# Patient Record
Sex: Female | Born: 1970 | Race: Black or African American | Hispanic: No | Smoking: Never smoker
Health system: Southern US, Community
[De-identification: ages and names within clinical notes are randomized; demographics above are authoritative.]

## PROBLEM LIST (undated history)

## (undated) DIAGNOSIS — E039 Hypothyroidism, unspecified: Secondary | ICD-10-CM

## (undated) DIAGNOSIS — E041 Nontoxic single thyroid nodule: Secondary | ICD-10-CM

## (undated) HISTORY — PX: ABDOMINAL HYSTERECTOMY: SHX81

## (undated) HISTORY — DX: Nontoxic single thyroid nodule: E04.1

## (undated) HISTORY — DX: Hypothyroidism, unspecified: E03.9

---

## 1998-11-20 DIAGNOSIS — E041 Nontoxic single thyroid nodule: Secondary | ICD-10-CM

## 1998-11-20 DIAGNOSIS — E039 Hypothyroidism, unspecified: Secondary | ICD-10-CM

## 1998-11-20 HISTORY — DX: Hypothyroidism, unspecified: E03.9

## 1998-11-20 HISTORY — DX: Nontoxic single thyroid nodule: E04.1

## 1998-12-21 ENCOUNTER — Ambulatory Visit (HOSPITAL_COMMUNITY): Admission: RE | Admit: 1998-12-21 | Discharge: 1998-12-21 | Payer: Self-pay | Admitting: Unknown Physician Specialty

## 1999-03-02 ENCOUNTER — Other Ambulatory Visit: Admission: RE | Admit: 1999-03-02 | Discharge: 1999-03-02 | Payer: Self-pay | Admitting: Family Medicine

## 1999-03-02 ENCOUNTER — Encounter: Payer: Self-pay | Admitting: Surgery

## 1999-03-04 ENCOUNTER — Ambulatory Visit (HOSPITAL_COMMUNITY): Admission: RE | Admit: 1999-03-04 | Discharge: 1999-03-05 | Payer: Self-pay | Admitting: Surgery

## 1999-03-21 HISTORY — PX: THYROIDECTOMY, PARTIAL: SHX18

## 2001-06-20 ENCOUNTER — Ambulatory Visit (HOSPITAL_COMMUNITY): Admission: RE | Admit: 2001-06-20 | Discharge: 2001-06-20 | Payer: Self-pay | Admitting: Obstetrics and Gynecology

## 2004-01-12 ENCOUNTER — Other Ambulatory Visit: Admission: RE | Admit: 2004-01-12 | Discharge: 2004-01-12 | Payer: Self-pay | Admitting: Family Medicine

## 2005-10-17 ENCOUNTER — Encounter: Admission: RE | Admit: 2005-10-17 | Discharge: 2005-10-17 | Payer: Self-pay | Admitting: Internal Medicine

## 2006-04-30 ENCOUNTER — Other Ambulatory Visit: Admission: RE | Admit: 2006-04-30 | Discharge: 2006-04-30 | Payer: Self-pay | Admitting: Internal Medicine

## 2006-05-04 ENCOUNTER — Encounter: Admission: RE | Admit: 2006-05-04 | Discharge: 2006-05-04 | Payer: Self-pay | Admitting: Internal Medicine

## 2007-05-14 ENCOUNTER — Inpatient Hospital Stay (HOSPITAL_COMMUNITY): Admission: RE | Admit: 2007-05-14 | Discharge: 2007-05-16 | Payer: Self-pay | Admitting: Obstetrics and Gynecology

## 2007-05-14 ENCOUNTER — Encounter (INDEPENDENT_AMBULATORY_CARE_PROVIDER_SITE_OTHER): Payer: Self-pay | Admitting: Obstetrics and Gynecology

## 2009-02-16 ENCOUNTER — Other Ambulatory Visit: Admission: RE | Admit: 2009-02-16 | Discharge: 2009-02-16 | Payer: Self-pay | Admitting: Internal Medicine

## 2010-03-10 ENCOUNTER — Encounter: Admission: RE | Admit: 2010-03-10 | Discharge: 2010-03-10 | Payer: Self-pay | Admitting: Internal Medicine

## 2011-04-04 NOTE — Op Note (Signed)
NAME:  Kiara Peters, Kiara Peters NO.:  [id number]   MEDICAL RECORD NO.:  [id number]          PATIENT TYPE:  AMB   LOCATION:  SDC                           FACILITY:  [facility]   PHYSICIAN:  Lenoard Aden, M.D.DATE OF BIRTH:  22-Jan-1971   DATE OF PROCEDURE:  05/14/2007  DATE OF DISCHARGE:                               OPERATIVE REPORT   PREOPERATIVE DIAGNOSIS:  Symptomatic fibroids, dysmenorrhea,  menorrhagia.   POSTOPERATIVE DIAGNOSIS:  Symptomatic fibroids, dysmenorrhea,  menorrhagia, plus enterocele.   PROCEDURE:  Diagnostic laparoscopy, total abdominal hysterectomy,  enterocele repair with Rogelio Seen culdoplasty.   SURGEON:  Lenoard Aden, M.D.   ASSISTANT:  Dr. Cherly Hensen   ANESTHESIA:  General.   ESTIMATED BLOOD LOSS:  400 mL   COMPLICATIONS:  None.   DRAINS:  Foley.   COUNTS:  Correct.  The patient went to recovery in good condition.   DESCRIPTION OF PROCEDURE:  After being apprised of risks of anesthesia,  infection, bleeding, injury to abdominal organs with need for repair,  delayed versus immediate complications to include bowel and bladder  injury, the patient was brought to the operating room where she was  administered a general anesthetic without complications, prepped, draped  in sterile fashion.  SED's are placed in normal thigh high fashion.  Feet placed in the yellow fin stirrups.  Foley catheter placed after the  patient's prepped and draped in sterile fashion.  Rumi retractor placed  per vagina.  Infraumbilical incision made with a scalpel.  Veress needle  placed opening pressure -5 noted, 4 liters CO2 insufflated without  difficulty.  Trocar visualization, trocar placed atraumatically.  Visualization noted that the uterus is about 16 weeks size with  bilateral lateral fibroid and inability to visualize the anterior  posterior cul-de-sac or the uterine vasculature. At this time decision  made to proceed with abdominal hysterectomy thereby the  instruments  removed from the umbilical port. Incision was closed using 0 Vicryl and  Dermabond.  Pfannenstiel's skin incision is made, carried down to fascia  which was nicked in midline and opened transversely using Mayo scissors.  Rectus muscles dissected sharply in midline, peritoneum entered sharply  and peritoneal entry made.  Uterus was elevated through the operative  field, round ligaments were bilaterally grasped and suture ligated and  retroperitoneal space is entered. Ureters identified bilaterally. Tubo-  ovarian round ligament complexes were bilaterally clamped and suture  ligated. Uterine vessels skeletonized bilaterally and clamped using  LigaSure on the left and doubly with Heaney clamp on the right. At this  time good hemostasis noted.  Progressive bites were taken down the broad  and cardinal ligament complex.  The bladder flap was developed sharply,  ureter's reidentified during the process. Uterine vessels having been  taken bilaterally.  The specimen is then truncated and the cervix is  visualized partial specimen is removed.  Cervix is then removed taking  progressive bites down the broad cardinal ligament complex, suture  ligating all pedicles. The uterosacral ligaments are ligated bilaterally  and held, vaginal entry made.  Specimen removed. Vagina closed side-to-  side using 0 Vicryl  interrupted suture. Enterocele was identified and  closed using a 0 Vicryl McCall culdoplasty suture.  Uterosacral  ligaments are transfixed to the vaginal cuff.  Irrigation was  accomplished.  Good hemostasis noted.  Ureters were bilaterally noted to  be peristalsing normally. At this time the irrigation is accomplished  and good hemostasis noted.  Fascia is then closed using 0 Monocryl in  continuous running fashion.  Subcutaneous tissue reapproximated using 0  plain suture.  Skin closed using staples.  The patient tolerates  procedure well and was transferred to recovery in good  condition.      Lenoard Aden, M.D.  Electronically Signed     RJT/MEDQ  D:  05/14/2007  T:  05/14/2007  Job:  [id number]

## 2011-04-04 NOTE — H&P (Signed)
NAME:  Kiara Peters, Kiara Peters NO.:  [id number]   MEDICAL RECORD NO.:  [id number]          PATIENT TYPE:  AMB   LOCATION:  SDC                           FACILITY:  [facility]   PHYSICIAN:  Lenoard Aden, M.D.DATE OF BIRTH:  1971-07-14   DATE OF ADMISSION:  DATE OF DISCHARGE:                              HISTORY & PHYSICAL   CHIEF COMPLAINT:  Dysmenorrhea, menorrhagia and symptomatic fibroids.  She is a 40 year old G2 status post tubal ligation and symptomatic  fibroids for definitive therapy.   She has no known drug allergies.   MEDICATIONS:  Are vitamins and Synthroid.   She is a nonsmoker and nondrinker. She denies domestic or physical  violence.   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 She has a family history of lung cancer,  diabetes and hypertension. She has had 2 uncomplicated pregnancies to  date.   On physical exam, blood pressure 120/78 with a weight of 217 pounds.  HEENT:  Is normal.  LUNGS:  Are clear.  HEART:  Regular rhythm.  ABDOMEN:  Soft, nontender. Uterus was mobile at 12 to 14 weeks' size. No  adnexal masses. Confirmed by ultrasound. Multiple 4 to 6 cm uterine  fibroids with bilateral normal ovaries.  EXTREMITIES:  Revealed no cords.  NEUROLOGIC EXAM:  Nonfocal.   IMPRESSION:  Symptomatic uterine fibroids with refractory dysmenorrhea,  menorrhagia and history of secondary anemia.   PLAN:  Is to proceed with LAVH versus TLH versus TAH with ovarian  conservation. Risks of anesthesia, infection, bleeding, injury to  abdominal organs and need for repair was discussed.  Delayed versus  immediate complications to include bowel and bladder injury noted.  Inability to perform this procedure laparoscopically with possible need  for abdominal approach was discussed with the patient. She acknowledges  and wishes to  proceed.      Lenoard Aden, M.D.  Electronically Signed     RJT/MEDQ  D:  05/14/2007  T:  05/14/2007  Job:  [id number]

## 2011-04-07 NOTE — Op Note (Signed)
[facility]  Patient:    Kiara Peters, Kiara Peters                        MRN: [id number] Proc. Date: 06/20/01 Attending:  Duke Salvia. Marcelle Overlie, M.D.                           Operative Report  PREOPERATIVE DIAGNOSIS:       Requests permanent sterilization.  POSTOPERATIVE DIAGNOSES:      1. Requests permanent sterilization.                               2. Post uterine leiomyoma.  OPERATION:                    Diagnostic laparoscopy with Filshie clip tubal ligation.  SURGEON:                      Duke Salvia. Marcelle Overlie, M.D.  ANESTHESIA:                   General endotracheal anesthesia.  COMPLICATIONS:                None.  DRAINS:                       In and out catheter.  ESTIMATED BLOOD LOSS:         Less than 5 cc.  DESCRIPTION OF PROCEDURE AND FINDINGS:  The patient was taken to the operating room.  After an adequate level of general endotracheal anesthesia was obtained and with legs in stirrups, the abdomen, perineum, and vagina were prepped and draped in the usual manner for laparoscopy.  The bladder was drained. Examination under anesthesia was carried out, and uterus was felt to be 6 to 7 week size, slightly irregular, adnexa unremarkable.  A Hulka tenaculum was positioned.  Attention was directed to the abdomen where a 2 cm subumbilical incision was made.  The Veress needle was introduced without difficulty.  A central abdominal position was verified by pressure and water testing.  After a 2 liter pneumoperitoneum was then created, laparoscopic trocar and sleeve were inserted without difficulty.  There was no evidence of any bleeding or trauma.  With the patient in Trendelenburg and uterus anteflexed, the pelvic findings were as follows.  The uterus itself was enlarge to 6 to 8-week size with 3 to 4 irregular leiomyoma.  The cul-de-sac and adnexal areas were unremarkable.  Marcaine 0.5% plain, 3 to 5 cc, were dripped across the tube from the  cornu to the fimbriated end on either side.  Filshie clip applicator was back loaded.  The right tube was positively identified and traced to its fimbriated end, regrasped 2 cm from the cornu, applied at a right angle and completely engulfing the tube with excellent application.  The exact same was repeated on the left side after carefully identifying the tube.  The applicator was removed.  Careful inspection magnified revealed that the Filshie clip application was excellent on each side. Instruments were removed, gas allowed to escape.  Defects closed with 4-0 Dexon subcuticular sutures.  She tolerated this well and went to the recovery room in good condition. DD:  06/20/01 TD:  06/20/01 Job: [id number]

## 2011-04-07 NOTE — Discharge Summary (Signed)
NAME:  VALMAI, VANDENBERGHE NO.:  [id number]   MEDICAL RECORD NO.:  [id number]          PATIENT TYPE:  INP   LOCATION:  9304                          FACILITY:  [facility]   PHYSICIAN:  Lenoard Aden, M.D.DATE OF BIRTH:  [birth year]   DATE OF ADMISSION:  05/14/2007  DATE OF DISCHARGE:  05/16/2007                               DISCHARGE SUMMARY   HOSPITAL COURSE:  The patient underwent an uncomplicated diagnostic  laparoscopic TAH with enterocele repair on May 14, 2007.  Postoperative  course uncomplicated.  Discharged to home on day #2.  Discharge teaching  done.   DISCHARGE MEDICATIONS:  1. Tylox.  2. Prenatal vitamins.  3. Iron.   FOLLOW UP:  Follow up in the office in 4-6 weeks.      Lenoard Aden, M.D.  Electronically Signed     RJT/MEDQ  D:  07/24/2007  T:  07/25/2007  Job:  [id number]

## 2011-04-07 NOTE — H&P (Signed)
[facility]  Patient:    Kiara Peters, Kiara Peters                        MRN: [id number] Attending:  Duke Salvia. Marcelle Overlie, M.D.                         History and Physical  SCHEDULED SURGERY DATE:       06/20/2001.  CHIEF COMPLAINT:              Requests permanent sterilization.  HISTORY OF PRESENT ILLNESS:   A 40 year old, G2, P2, LC2, using condoms for contraception, presents for tubal ligation. She was sure she would not want to be pregnant again under any circumstances. The tubal procedure, including risk relative to bleeding, infection, adjacent organ injury with the possible need for open or additional surgery, permanence of the procedure, and failure rate of 2 to 3 per 1000 all reviewed with her. She remains firm in her decision to proceed.  PAST SURGICAL HISTORY:        One cesarean section, one vaginal delivery. Other surgery includes history of a prior thyroidectomy.  PHYSICAL EXAMINATION:  VITAL SIGNS:                  Temperature 98.2, blood pressure 120/72.  HEENT:                        Unremarkable.  NECK:                         Supple without masses.  LUNGS:                        Clear.  CARDIOVASCULAR:               Regular rate and rhythm without murmurs, rubs, or gallops.  BREASTS:                      Without masses.  ABDOMEN:                      Soft, flat, nontender.  PELVIC:                       Normal external genitalia. Vagina and cervix clear. Uterus anterior, normal size, adnexa negative.  IMPRESSION:                   Requests permanent sterilization.  PLAN:                         Tubal ligation by Filshie clip application. Procedure and risks discussed as above. DD:  06/18/01 TD:  06/18/01 Job: [id number]

## 2011-05-22 ENCOUNTER — Other Ambulatory Visit: Payer: Self-pay | Admitting: Internal Medicine

## 2011-05-22 DIAGNOSIS — Z1231 Encounter for screening mammogram for malignant neoplasm of breast: Secondary | ICD-10-CM

## 2011-05-26 ENCOUNTER — Ambulatory Visit
Admission: RE | Admit: 2011-05-26 | Discharge: 2011-05-26 | Disposition: A | Discharge status: Home / Self Care | Payer: BC Managed Care – PPO | Source: Ambulatory Visit | Attending: Internal Medicine | Admitting: Internal Medicine

## 2011-05-26 DIAGNOSIS — Z1231 Encounter for screening mammogram for malignant neoplasm of breast: Secondary | ICD-10-CM

## 2011-09-06 LAB — CBC
HCT: 16 — ABNORMAL LOW
HCT: 19.3 — ABNORMAL LOW
HCT: 32.1 — ABNORMAL LOW
Hemoglobin: 10.4 — ABNORMAL LOW
Hemoglobin: 5.2 — CL
Hemoglobin: 6.3 — CL
MCHC: 32.2
MCHC: 32.5
MCV: 83
RBC: 1.92 — ABNORMAL LOW
RBC: 2.38 — ABNORMAL LOW
RBC: 3.96
RDW: 16 — ABNORMAL HIGH
RDW: 16.1 — ABNORMAL HIGH
WBC: 15.1 — ABNORMAL HIGH

## 2011-09-06 LAB — SAMPLE TO BLOOD BANK

## 2012-05-16 ENCOUNTER — Other Ambulatory Visit: Payer: Self-pay | Admitting: Internal Medicine

## 2012-05-16 DIAGNOSIS — Z1231 Encounter for screening mammogram for malignant neoplasm of breast: Secondary | ICD-10-CM

## 2012-06-14 ENCOUNTER — Ambulatory Visit
Admission: RE | Admit: 2012-06-14 | Discharge: 2012-06-14 | Disposition: A | Discharge status: Home / Self Care | Payer: BC Managed Care – PPO | Source: Ambulatory Visit | Attending: Internal Medicine | Admitting: Internal Medicine

## 2012-06-14 DIAGNOSIS — Z1231 Encounter for screening mammogram for malignant neoplasm of breast: Secondary | ICD-10-CM

## 2013-09-18 ENCOUNTER — Other Ambulatory Visit: Payer: Self-pay

## 2013-09-18 DIAGNOSIS — Z1231 Encounter for screening mammogram for malignant neoplasm of breast: Secondary | ICD-10-CM

## 2013-11-03 ENCOUNTER — Ambulatory Visit
Admission: RE | Admit: 2013-11-03 | Discharge: 2013-11-03 | Disposition: A | Discharge status: Home / Self Care | Payer: BC Managed Care – PPO | Source: Ambulatory Visit

## 2013-11-03 DIAGNOSIS — Z1231 Encounter for screening mammogram for malignant neoplasm of breast: Secondary | ICD-10-CM

## 2014-10-12 ENCOUNTER — Other Ambulatory Visit: Payer: Self-pay

## 2014-10-12 DIAGNOSIS — Z1231 Encounter for screening mammogram for malignant neoplasm of breast: Secondary | ICD-10-CM

## 2014-11-04 ENCOUNTER — Ambulatory Visit: Payer: BC Managed Care – PPO

## 2015-07-09 ENCOUNTER — Ambulatory Visit
Admission: RE | Admit: 2015-07-09 | Discharge: 2015-07-09 | Disposition: A | Discharge status: Home / Self Care | Payer: BC Managed Care – PPO | Source: Ambulatory Visit

## 2015-07-09 DIAGNOSIS — Z1231 Encounter for screening mammogram for malignant neoplasm of breast: Secondary | ICD-10-CM

## 2016-06-28 ENCOUNTER — Other Ambulatory Visit: Payer: Self-pay | Admitting: Internal Medicine

## 2016-06-28 DIAGNOSIS — R109 Unspecified abdominal pain: Secondary | ICD-10-CM

## 2016-06-28 DIAGNOSIS — R102 Pelvic and perineal pain: Secondary | ICD-10-CM

## 2016-06-29 ENCOUNTER — Other Ambulatory Visit: Payer: Self-pay | Admitting: Internal Medicine

## 2016-06-29 DIAGNOSIS — Z1231 Encounter for screening mammogram for malignant neoplasm of breast: Secondary | ICD-10-CM

## 2016-07-07 ENCOUNTER — Ambulatory Visit
Admission: RE | Admit: 2016-07-07 | Discharge: 2016-07-07 | Disposition: A | Discharge status: Home / Self Care | Payer: BC Managed Care – PPO | Source: Ambulatory Visit | Attending: Internal Medicine | Admitting: Internal Medicine

## 2016-07-07 DIAGNOSIS — R109 Unspecified abdominal pain: Secondary | ICD-10-CM

## 2016-07-07 DIAGNOSIS — R102 Pelvic and perineal pain: Secondary | ICD-10-CM

## 2016-07-09 ENCOUNTER — Emergency Department (HOSPITAL_COMMUNITY): Payer: BC Managed Care – PPO

## 2016-07-09 ENCOUNTER — Encounter (HOSPITAL_COMMUNITY): Payer: Self-pay | Admitting: Emergency Medicine

## 2016-07-09 ENCOUNTER — Emergency Department (HOSPITAL_COMMUNITY)
Admission: EM | Admit: 2016-07-09 | Discharge: 2016-07-09 | Disposition: A | Discharge status: Home / Self Care | Payer: BC Managed Care – PPO | Attending: Emergency Medicine | Admitting: Emergency Medicine

## 2016-07-09 DIAGNOSIS — M79605 Pain in left leg: Secondary | ICD-10-CM | POA: Diagnosis not present

## 2016-07-09 LAB — I-STAT CHEM 8, ED
BUN: 10 mg/dL (ref 6–20)
CALCIUM ION: 1.15 mmol/L (ref 1.13–1.30)
CHLORIDE: 104 mmol/L (ref 101–111)
Creatinine, Ser: 0.7 mg/dL (ref 0.44–1.00)
GLUCOSE: 109 mg/dL — AB (ref 65–99)
HCT: 42 % (ref 36.0–46.0)
Hemoglobin: 14.3 g/dL (ref 12.0–15.0)
Potassium: 4.1 mmol/L (ref 3.5–5.1)
Sodium: 139 mmol/L (ref 135–145)
TCO2: 23 mmol/L (ref 0–100)

## 2016-07-09 LAB — CK: CK TOTAL: 210 U/L (ref 38–234)

## 2016-07-09 MED ORDER — DICLOFENAC SODIUM 1 % TD GEL: 4.0000 g | Freq: Four times a day (QID) | TRANSDERMAL | 0 refills | Status: DC

## 2016-07-09 MED ORDER — IBUPROFEN 400 MG PO TABS
600.0000 mg | ORAL_TABLET | Freq: Once | ORAL | Status: AC
  Administered 2016-07-09: 600 mg via ORAL
  Filled 2016-07-09: qty 1

## 2016-07-09 NOTE — ED Provider Notes (Signed)
[facility]-EMERGENCY DEPT Provider Note   CSN: [id number] Arrival date & time: 07/09/16  0557     History   Chief Complaint Chief Complaint  Patient presents with  . Leg Pain    Pt reports pain in her left hip and groin pain.  Pt states that she had a transvaginal US on friday that she feels increased her hip pain.    HPI Kiara Peters is a 45 y.o. female who presents with left anterior thigh/hip pain. PMH significant for obesity, thyroidectomy, and hysterectomy. She states that her pain started yesterday. She has had this pain before in June. She put Voltaren gel on it and it got better. She had a TVUS on Friday which she thinks may have brought on her pain again. She also reports increasing her workouts doing burpees and running. The pain is on the left side. It is constant, worse with movement, and aggravated by walking however she is able to ambulate. She has tried Aleve with no relief. Denies new abdominal pain (has chronic pain), N/V/D, irritative voiding symptoms, vaginal discharge/bleeding, hx of hernia.  HPI  No past medical history on file.  There are no active problems to display for this patient.   No past surgical history on file.  OB History    No data available       Home Medications    Prior to Admission medications   Not on File    Family History No family history on file.  Social History Social History  Substance Use Topics  . Smoking status: Never Smoker  . Smokeless tobacco: Not on file  . Alcohol use No     Allergies   Review of patient's allergies indicates no known allergies.   Review of Systems Review of Systems  Musculoskeletal: Positive for gait problem and myalgias. Negative for arthralgias.  Neurological: Negative for weakness and numbness.  All other systems reviewed and are negative.    Physical Exam Updated Vital Signs BP (!) 140/110 (BP Location: Right Arm)   Pulse 68   Temp 97.9 F (36.6 C) (Oral)   Resp 18   Ht 5\' 2"   (1.575 m)   Wt 108.9 kg   SpO2 98%   BMI 43.90 kg/m   Physical Exam  Constitutional: She is oriented to person, place, and time. She appears well-developed and well-nourished. No distress.  HENT:  Head: Normocephalic and atraumatic.  Eyes: Conjunctivae are normal. Pupils are equal, round, and reactive to light. Right eye exhibits no discharge. Left eye exhibits no discharge. No scleral icterus.  Neck: Normal range of motion. Neck supple.  Cardiovascular: Normal rate and regular rhythm.   No murmur heard. Pulmonary/Chest: Effort normal and breath sounds normal. No respiratory distress.  Abdominal: Soft. She exhibits no distension. There is no tenderness.  Musculoskeletal: She exhibits no edema.  Left hip: No obvious swelling or deformity. Tenderness to palpation over the proximal anterior and medial thigh. FROM of hip. Pain with extension of hip. N/V intact.   Neurological: She is alert and oriented to person, place, and time.  Skin: Skin is warm and dry.  Psychiatric: She has a normal mood and affect. Her behavior is normal.  Nursing note and vitals reviewed.    ED Treatments / Results  Labs (all labs ordered are listed, but only abnormal results are displayed) Labs Reviewed  I-STAT CHEM 8, ED - Abnormal; Notable for the following:       Result Value   Glucose, Bld 109 (*)  All other components within normal limits  CK    EKG  EKG Interpretation None       Radiology US Abdomen Complete  Result Date: 07/07/2016 CLINICAL DATA:  Abdominal pain. EXAM: ABDOMEN ULTRASOUND COMPLETE COMPARISON:  Ultrasound 05/04/2006. FINDINGS: Gallbladder: No gallstones or wall thickening visualized. No sonographic Murphy sign noted by sonographer. Common bile duct: Diameter: 3.2 mm Liver: No focal lesion identified. Within normal limits in parenchymal echogenicity. IVC: No abnormality visualized. Pancreas: Visualized portion unremarkable. Spleen: Size and appearance within normal limits.  Right Kidney: Length: 11.4 cm. Echogenicity within normal limits. No mass or hydronephrosis visualized. Left Kidney: Length: 10.9 cm. Echogenicity within normal limits. No mass or hydronephrosis visualized. Abdominal aorta: No aneurysm visualized. Other findings: None. IMPRESSION: No acute or focal abnormality. Electronically Signed   By: Maisie Fus  Register   On: 07/07/2016 11:22   US Transvaginal Non-ob  Result Date: 07/07/2016 CLINICAL DATA:  Left lower quadrant pain. EXAM: TRANSABDOMINAL AND TRANSVAGINAL ULTRASOUND OF PELVIS TECHNIQUE: Both transabdominal and transvaginal ultrasound examinations of the pelvis were performed. Transabdominal technique was performed for global imaging of the pelvis including uterus, ovaries, adnexal regions, and pelvic cul-de-sac. It was necessary to proceed with endovaginal exam following the transabdominal exam to visualize the uterus and ovaries. COMPARISON:  05/04/2006. FINDINGS: Uterus Hysterectomy. Right ovary Measurements: 4.3 x 1.7 x 2.9 cm. 2.2 x 1.3 x 1.8 cm simple cyst. Multiple small cysts, most likely follicular cyst. Some contain mild debris. Left ovary Measurements: 3.1 x 2.6 x 1.2 cm. Normal appearance/no adnexal mass. Other findings No abnormal free fluid. IMPRESSION: 1. Hysterectomy. 2. Small cysts right ovary, most likely small follicular cysts. Some contain mild amount of debris. Short-interval follow up ultrasound in 6-12 weeks is recommended, preferably during the week following the patient's normal menses. Electronically Signed   By: Maisie Fus  Register   On: 07/07/2016 14:36   US Pelvis Complete  Result Date: 07/07/2016 CLINICAL DATA:  Left lower quadrant pain. EXAM: TRANSABDOMINAL AND TRANSVAGINAL ULTRASOUND OF PELVIS TECHNIQUE: Both transabdominal and transvaginal ultrasound examinations of the pelvis were performed. Transabdominal technique was performed for global imaging of the pelvis including uterus, ovaries, adnexal regions, and pelvic cul-de-sac.  It was necessary to proceed with endovaginal exam following the transabdominal exam to visualize the uterus and ovaries. COMPARISON:  05/04/2006. FINDINGS: Uterus Hysterectomy. Right ovary Measurements: 4.3 x 1.7 x 2.9 cm. 2.2 x 1.3 x 1.8 cm simple cyst. Multiple small cysts, most likely follicular cyst. Some contain mild debris. Left ovary Measurements: 3.1 x 2.6 x 1.2 cm. Normal appearance/no adnexal mass. Other findings No abnormal free fluid. IMPRESSION: 1. Hysterectomy. 2. Small cysts right ovary, most likely small follicular cysts. Some contain mild amount of debris. Short-interval follow up ultrasound in 6-12 weeks is recommended, preferably during the week following the patient's normal menses. Electronically Signed   By: Maisie Fus  Register   On: 07/07/2016 14:36    Procedures Procedures (including critical care time)  Medications Ordered in ED Medications  ibuprofen (ADVIL,MOTRIN) tablet 600 mg (600 mg Oral Given 07/09/16 3149)     Initial Impression / Assessment and Plan / ED Course  I have reviewed the triage vital signs and the nursing notes.  Pertinent labs & imaging results that were available during my care of the patient were reviewed by me and considered in my medical decision making (see chart for details).  Clinical Course   45 year old female presents with left anterior proximal thigh pain most consistent with muscle strain. Xray is  negative for hip pathology. Blood work is unremarkable. CK is normal. Ibuprofen given - she appears overall comfortable. Will rx Voltaren gel since that has worked for her in the past. Advised modified workouts and heat/ice prn. Patient is NAD, non-toxic, with stable VS. Patient is informed of clinical course, understands medical decision making process, and agrees with plan. Opportunity for questions provided and all questions answered. Return precautions given.   Final Clinical Impressions(s) / ED Diagnoses   Final diagnoses:  Left leg pain     New Prescriptions New Prescriptions   DICLOFENAC SODIUM (VOLTAREN) 1 % GEL    Apply 4 g topically 4 (four) times daily.     Bethel BornKelly Marie Rage Beever, PA-C 07/09/16 [id number]    Loren Raceravid Yelverton, MD 07/09/16 [phone]

## 2016-07-09 NOTE — ED Notes (Signed)
Patient not in room

## 2016-07-09 NOTE — ED Triage Notes (Signed)
Pt arrives to D31 at this time via ED [facility].  Pt reports that she has pain in her left hip that started on Saturday following her transvaginal [place].   Chief Complaint  Patient presents with  . Leg Pain    Pt reports pain in her left hip and groin pain.  Pt states that she had a transvaginal US on friday that she feels increased her hip pain.   No past medical history on file.

## 2016-07-09 NOTE — ED Notes (Signed)
D/C instructions reviewed with patient and she verbalizes understanding and going home in good spirits

## 2016-07-09 NOTE — ED Notes (Signed)
Patient arrived in room.  

## 2016-07-09 NOTE — ED Notes (Signed)
Provider at bedside

## 2016-07-13 ENCOUNTER — Ambulatory Visit: Payer: BC Managed Care – PPO

## 2016-07-19 ENCOUNTER — Ambulatory Visit
Admission: RE | Admit: 2016-07-19 | Discharge: 2016-07-19 | Disposition: A | Discharge status: Home / Self Care | Payer: BC Managed Care – PPO | Source: Ambulatory Visit | Attending: Internal Medicine | Admitting: Internal Medicine

## 2016-07-19 DIAGNOSIS — Z1231 Encounter for screening mammogram for malignant neoplasm of breast: Secondary | ICD-10-CM

## 2016-11-15 ENCOUNTER — Ambulatory Visit (HOSPITAL_COMMUNITY)
Admission: EM | Admit: 2016-11-15 | Discharge: 2016-11-15 | Disposition: A | Discharge status: Home / Self Care | Payer: BC Managed Care – PPO | Attending: Family Medicine | Admitting: Family Medicine

## 2016-11-15 ENCOUNTER — Encounter (HOSPITAL_COMMUNITY): Payer: Self-pay | Admitting: Emergency Medicine

## 2016-11-15 DIAGNOSIS — R002 Palpitations: Secondary | ICD-10-CM | POA: Diagnosis not present

## 2016-11-15 NOTE — ED Triage Notes (Signed)
The [facility] with a complaint of a "fluttering" on the left side of her chest for 2 weeks. The patient denied any pain. The patient stated that she did have an EKG done at her PCP's office and they stated "it was normal."

## 2016-11-15 NOTE — ED Provider Notes (Signed)
[facility]    CSN: [id number] Arrival date & time: 11/15/16  1710     History   Chief Complaint Chief Complaint  Patient presents with  . Palpitations    HPI Kiara Peters is a 45 y.o. female.   The history is provided by the patient.  Palpitations  Palpitations quality:  Regular Onset quality:  Sudden Duration:  2 weeks Progression:  Unchanged Chronicity:  New Context: anxiety   Context: not caffeine and not stimulant use   Relieved by:  None tried Worsened by:  Nothing Ineffective treatments:  None tried Associated symptoms: no chest pain, no chest pressure, no dizziness, no nausea and no shortness of breath     History reviewed. No pertinent past medical history.  There are no active problems to display for this patient.   Past Surgical History:  Procedure Laterality Date  . ABDOMINAL HYSTERECTOMY    . THYROIDECTOMY, PARTIAL      OB History    No data available       Home Medications    Prior to Admission medications   Medication Sig Start Date End Date Taking? Authorizing Provider  levothyroxine (SYNTHROID, LEVOTHROID) 88 MCG tablet Take 88 mcg by mouth daily before breakfast.   Yes Historical Provider, MD    Family History History reviewed. No pertinent family history.  Social History Social History  Substance Use Topics  . Smoking status: Never Smoker  . Smokeless tobacco: Never Used  . Alcohol use No     Allergies   Patient has no known allergies.   Review of Systems Review of Systems  Constitutional: Negative.   Respiratory: Negative.  Negative for shortness of breath.   Cardiovascular: Positive for palpitations. Negative for chest pain.  Gastrointestinal: Negative for nausea.  Neurological: Negative for dizziness.  All other systems reviewed and are negative.    Physical Exam Triage Vital Signs ED Triage Vitals  Enc Vitals Group     BP 11/15/16 1754 154/95     Pulse Rate 11/15/16 1754 86     Resp 11/15/16  1754 18     Temp 11/15/16 1754 97.9 F (36.6 C)     Temp Source 11/15/16 1754 Oral     SpO2 11/15/16 1754 100 %     Weight --      Height --      Head Circumference --      Peak Flow --      Pain Score 11/15/16 1758 0     Pain Loc --      Pain Edu? --      Excl. in GC? --    No data found.   Updated Vital Signs BP 154/95 (BP Location: Right Wrist)   Pulse 86   Temp 97.9 F (36.6 C) (Oral)   Resp 18   SpO2 100%   Visual Acuity Right Eye Distance:   Left Eye Distance:   Bilateral Distance:    Right Eye Near:   Left Eye Near:    Bilateral Near:     Physical Exam  Constitutional: She appears well-developed and well-nourished. No distress.  Neck: Normal range of motion. Neck supple. No thyromegaly present.  Cardiovascular: Normal rate, regular rhythm, normal heart sounds and intact distal pulses.   Pulmonary/Chest: Effort normal and breath sounds normal.  Lymphadenopathy:    She has no cervical adenopathy.  Skin: Skin is warm and dry.  Nursing note and vitals reviewed.    UC Treatments / Results  Labs (all  labs ordered are listed, but only abnormal results are displayed) Labs Reviewed - No data to display  EKG  EKG Interpretation None       Radiology No results found.  Procedures Procedures (including critical care time)  Medications Ordered in UC Medications - No data to display   Initial Impression / Assessment and Plan / UC Course  I have reviewed the triage vital signs and the nursing notes.  Pertinent labs & imaging results that were available during my care of the patient were reviewed by me and considered in my medical decision making (see chart for details).  Clinical Course       Final Clinical Impressions(s) / UC Diagnoses   Final diagnoses:  None    New Prescriptions New Prescriptions   No medications on file     Linna HoffJames D Kindl, MD 11/28/16 1018

## 2016-11-15 NOTE — Discharge Instructions (Signed)
See cardiologist if further problems.

## 2016-11-22 ENCOUNTER — Encounter: Payer: Self-pay | Admitting: Physician Assistant

## 2016-11-22 ENCOUNTER — Ambulatory Visit (INDEPENDENT_AMBULATORY_CARE_PROVIDER_SITE_OTHER): Payer: BC Managed Care – PPO | Admitting: Physician Assistant

## 2016-11-22 VITALS — BP 125/84 | HR 81 | Ht 62.0 in | Wt 243.8 lb

## 2016-11-22 DIAGNOSIS — R002 Palpitations: Secondary | ICD-10-CM

## 2016-11-22 NOTE — Patient Instructions (Signed)
Testing/Procedures: CALL IF YOU DECIDE TO HAVE EVENT MONITOR OR ECHO TESTING  Follow-Up: AS NEEDED OR FOR TESTING  Special Instructions: OBTAIN THYROID LAB FROM PRIMARY CARE AND HAVE THEM FAX TO [phone]  HAPPY NEW YEAR!    Thank you for choosing [facility]!!    Paxton Kanaan, LPN RHONDA BARRETT, PA-C

## 2016-11-22 NOTE — Progress Notes (Signed)
Cardiology Office Note   Date:  11/22/2016   ID:  Kiara Peters, DOB 09-27-1971, MRN [id number]  PCP:  Georgianne Fick, MD  Cardiologist:  New, Dr [place]  Fanta Wimberley, PA-C   Chief Complaint  Patient presents with  . Palpitations    History of Present Illness: Kiara Peters is a 46 y.o. female with a history of obesity, part thyroidectomy  12/27, ER visit for palpitations, appt made  Kiara Peters presents for cardiology evaluation.  She has been on thyroid supplement since 2000. She last had blood drawn within the last couple of months and was told her levels were fine.   In November, she started noticing palpitations. They gradually increased in frequency. They concerned her. She would take a deep breath and it would help. She started getting them multiple times/day. Never got light-headed or dizzy. Describes a "fluttering" feeling, racing at times. It never gave her chest pain or SOB.   She drinks very little caffeine. She still grieves her husband, the holidays are a little hard for her. 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 She feels her BP has been higher than usual. She feels she has strategies in place and administrative support, so that is getting better. She drinks an occasional glass of wine, no drug use.  She was sick in early November, took Mucinex, drank green tea with Elderberry, black seed oil, hemp oil, cayenne pepper, lemon, and honey.  She exercises, doing cardio and is starting to do yoga. She is doing MMA as well.   She has not had the fluttering in the last 4 days.    Past Medical History:  Diagnosis Date  . Hypothyroidism 2000   Pt not sure if it was iatrogenic or not.  . Thyroid nodule 2000   Removed, Benign    Past Surgical History:  Procedure Laterality Date  . ABDOMINAL HYSTERECTOMY    . THYROIDECTOMY, PARTIAL  03/1999   benign nodule    Current Outpatient Prescriptions    Medication Sig Dispense Refill  . levothyroxine (SYNTHROID, LEVOTHROID) 88 MCG tablet Take 88 mcg by mouth daily before breakfast.     No current facility-administered medications for this visit.     Allergies:   Patient has no known allergies.    Social History:  The patient  reports that she has never smoked. She has never used smokeless tobacco. She reports that she does not drink alcohol or use drugs.   Family History:  The patient's family history includes Cancer in her father and mother; Diabetes in her brother, mother, and sister; Heart failure in her mother; Hyperlipidemia in her brother; Hypertension in her brother, mother, sister, and sister.    ROS:  Please see the history of present illness. All other systems are reviewed and negative.    PHYSICAL EXAM: VS:  BP 125/84   Pulse 81   Ht 5\' 2"  (1.575 m)   Wt 243 lb 12.8 oz (110.6 kg)   BMI 44.59 kg/m  , BMI Body mass index is 44.59 kg/m. GEN: Well nourished, well developed, female in no acute distress  HEENT: normal for age  Neck: no JVD, no carotid bruit, no masses Cardiac: RRR; no murmur, no rubs, or gallops Respiratory:  clear to auscultation bilaterally, normal work of breathing GI: soft, nontender, nondistended, + BS MS: no deformity or atrophy; no edema; distal pulses are 2+ in all 4 extremities   Skin: warm  and dry, no rash Neuro:  Strength and sensation are intact Psych: euthymic mood, full affect   EKG:  EKG is ordered today. The ekg ordered today demonstrates SR, HR 81. Normal intervals. No ischemic changes   Recent Labs: 07/09/2016: BUN 10; Creatinine, Ser 0.70; Hemoglobin 14.3; Potassium 4.1; Sodium 139    Lipid Panel No results found for: CHOL, TRIG, HDL, CHOLHDL, VLDL, LDLCALC, LDLDIRECT   Wt Readings from Last 3 Encounters:  11/22/16 243 lb 12.8 oz (110.6 kg)  07/09/16 240 lb (108.9 kg)     Other studies Reviewed: Additional studies/ records that were reviewed today include: ER  records.  ASSESSMENT AND PLAN: The pt was reviewed by Dr SwazilandJordan, who approved the plan.  1.  Palpitations: No ECG is available from the urgent care visit. The palpitations have not been recorded. I advised the patient that for complete evaluation, she should wear an event monitor and we should get an echocardiogram.   However, I also advised her that although I had not seen the palpitations on a telemetry strip or ECG, I had no evidence by physical exam or by ECG that they were anything harmful to her.  The palpitations do not cause chest pain, presyncope or shortness of breath. She has no symptoms of heart failure or ischemia on her ECG or exam. There are no concerning murmurs or other significant abnormalities on her physical exam.   It is interesting that the palpitations started at the same time that she started drinking the green tea with herbal supplements. However, unless she is getting too much caffeine from the green tea, the supplements that she puts in it are not known to cause palpitations, a blood pressure increase or a rapid heart rate.  Because I have no evidence that there is anything harmful to the palpitations, I advised that if she wished to defer further workup for now, that would be okay. She wishes to do that. She is aware that the workup would include an event monitor and echocardiogram. She will contact us if she wishes to proceed with this. For now, I will hold off on ordering them.   She was reassured that there was no overt evidence of anything harmful to her and understands that she can decide on further evaluation at any time.  She is requested to get copies of her most recent labs including a TSH from her family physician so that we may have it in her records.   Current medicines are reviewed at length with the patient today.  The patient does not have concerns regarding medicines.  The following changes have been made:  no change  Labs/ tests ordered today include:   No orders of the defined types were placed in this encounter.    Disposition:   FU with Dr SwazilandJordan as needed or after testing  Signed, Leanna BattlesBarrett, Niaomi Cartaya, PA-C  11/22/2016 4:00 PM    [facility] Phone: [phone]; Fax: [phone]  This note was written with the assistance of speech recognition software. Please excuse any transcriptional errors.

## 2017-01-30 DIAGNOSIS — R002 Palpitations: Secondary | ICD-10-CM | POA: Insufficient documentation

## 2017-02-26 ENCOUNTER — Ambulatory Visit (INDEPENDENT_AMBULATORY_CARE_PROVIDER_SITE_OTHER): Payer: BC Managed Care – PPO

## 2017-02-26 DIAGNOSIS — R002 Palpitations: Secondary | ICD-10-CM

## 2017-03-01 ENCOUNTER — Telehealth: Payer: Self-pay | Admitting: Internal Medicine

## 2017-03-01 ENCOUNTER — Telehealth: Payer: Self-pay | Admitting: Cardiology

## 2017-03-01 NOTE — Telephone Encounter (Signed)
Received critical holter monitor value from [employer]. It will be downloaded from [facility] and sent to the DOD, Dr. Royann Shivers.

## 2017-03-01 NOTE — Telephone Encounter (Signed)
New Message     Critical holter study

## 2017-03-01 NOTE — Telephone Encounter (Signed)
NewMessage      Critical holter monitor

## 2017-07-24 ENCOUNTER — Other Ambulatory Visit: Payer: Self-pay | Admitting: Family Medicine

## 2017-07-24 DIAGNOSIS — Z1231 Encounter for screening mammogram for malignant neoplasm of breast: Secondary | ICD-10-CM

## 2017-08-01 ENCOUNTER — Ambulatory Visit
Admission: RE | Admit: 2017-08-01 | Discharge: 2017-08-01 | Disposition: A | Discharge status: Home / Self Care | Payer: BC Managed Care – PPO | Source: Ambulatory Visit | Attending: Family Medicine | Admitting: Family Medicine

## 2017-08-01 DIAGNOSIS — Z1231 Encounter for screening mammogram for malignant neoplasm of breast: Secondary | ICD-10-CM

## 2017-08-31 IMAGING — US US PELVIS COMPLETE
1 series · 14 of 25 positions shown · non-contrast
Comparison: 05/04/2006.

CLINICAL DATA: Left lower quadrant pain.

EXAM:
TRANSABDOMINAL AND TRANSVAGINAL ULTRASOUND OF PELVIS
TECHNIQUE: Both transabdominal and transvaginal ultrasound examinations of the
pelvis were performed. Transabdominal technique was performed for
global imaging of the pelvis including uterus, ovaries, adnexal
regions, and pelvic cul-de-sac. It was necessary to proceed with
endovaginal exam following the transabdominal exam to visualize the
uterus and ovaries.

[Series 1: us pelvis complete · 0.28mm/px · 14 of 43 slices shown]
[im 1/43]
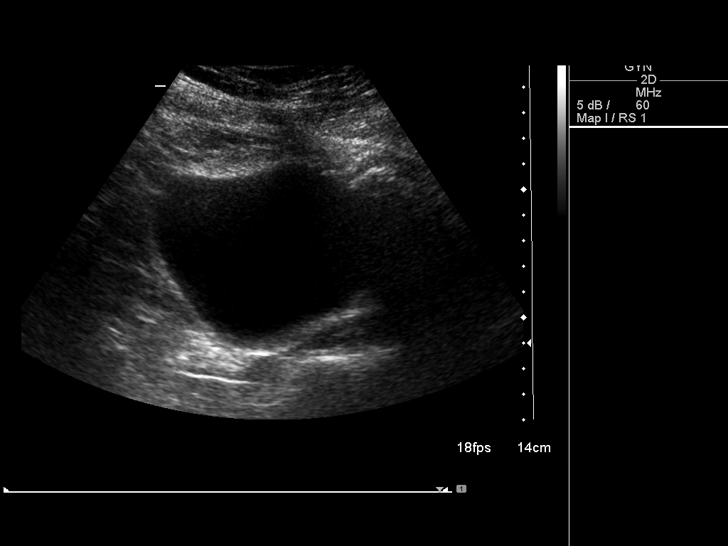
[im 4/43]
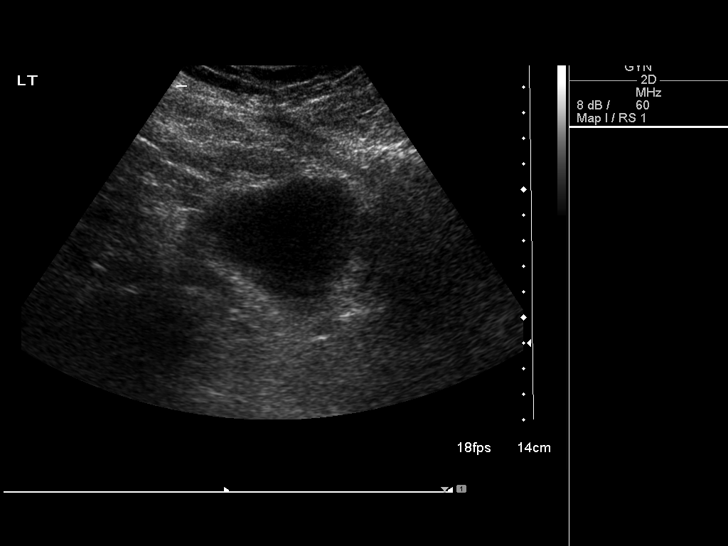
[im 8/43]
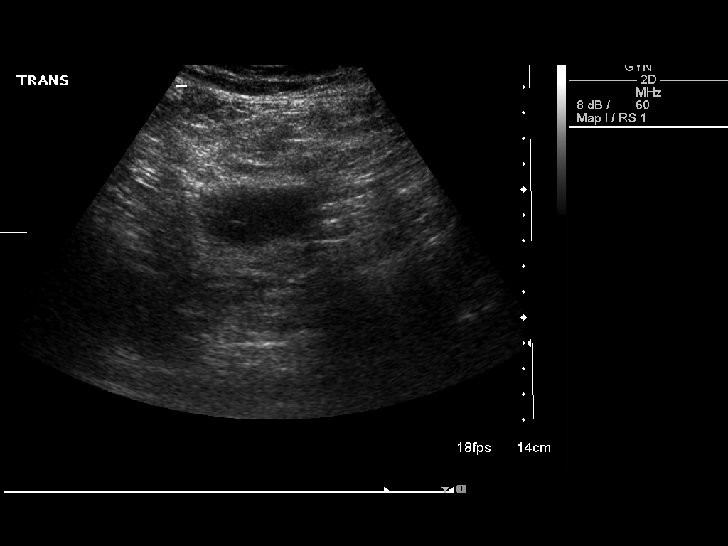
[im 11/43]
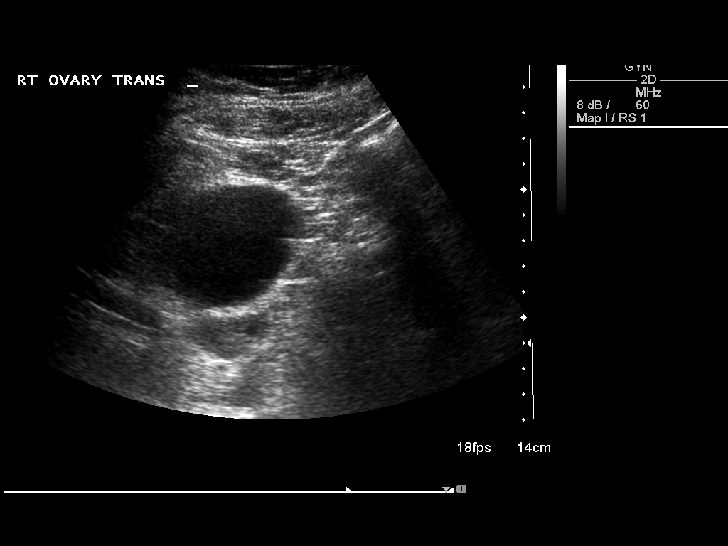
[im 15/43]
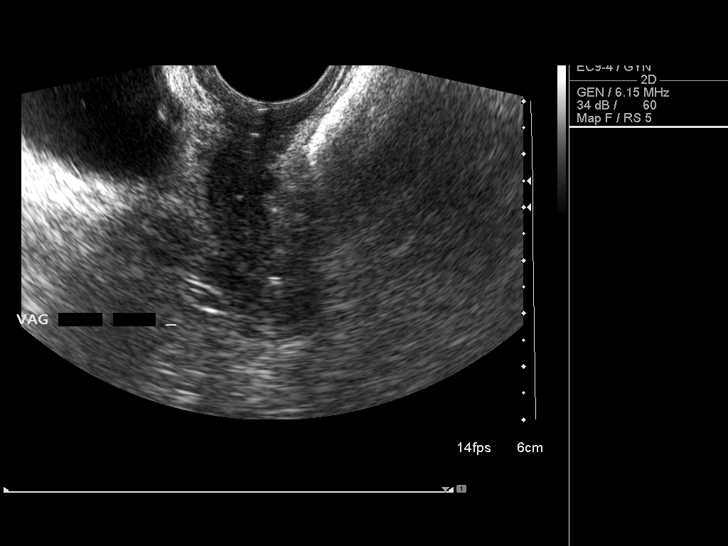
[im 16/43]
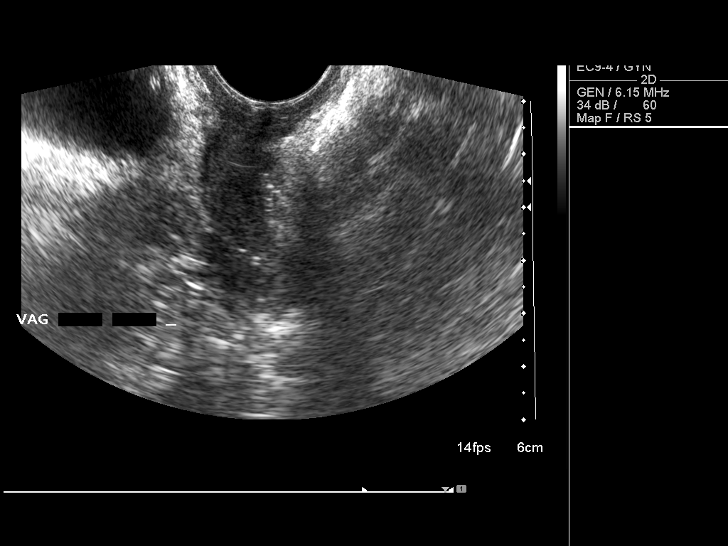
[im 20/43]
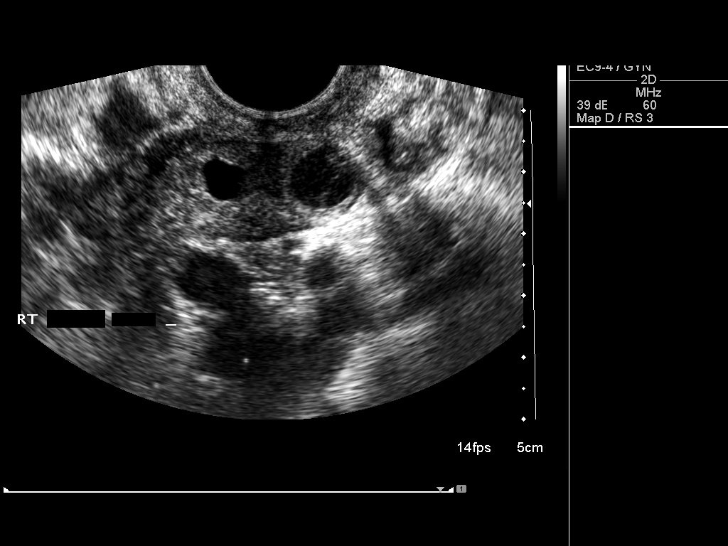
[im 23/43]
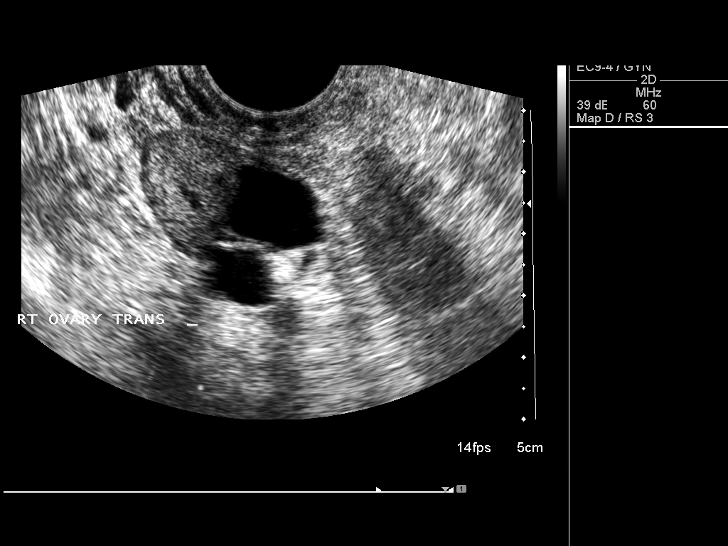
[im 27/43]
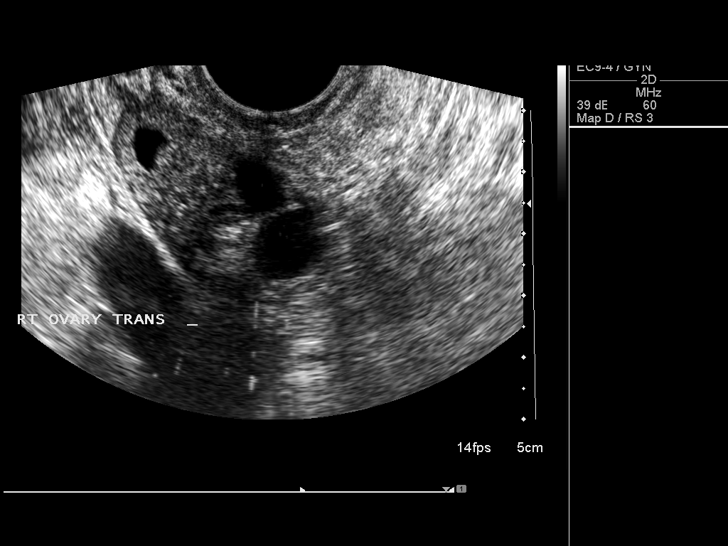
[im 29/43]
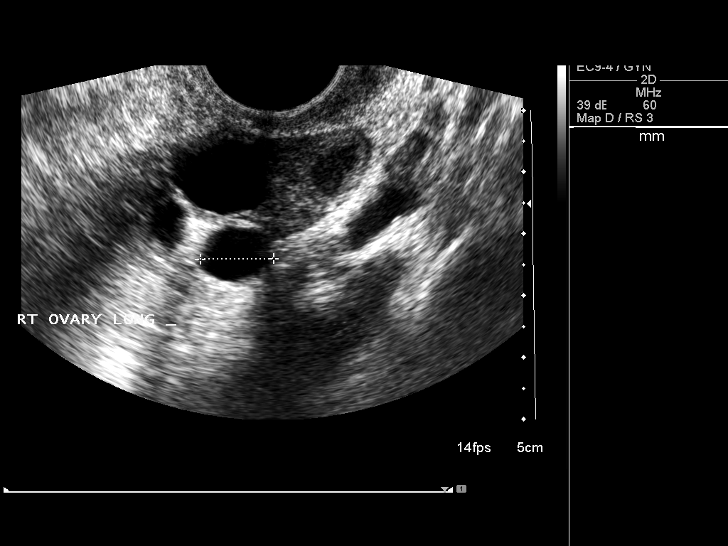
[im 32/43]
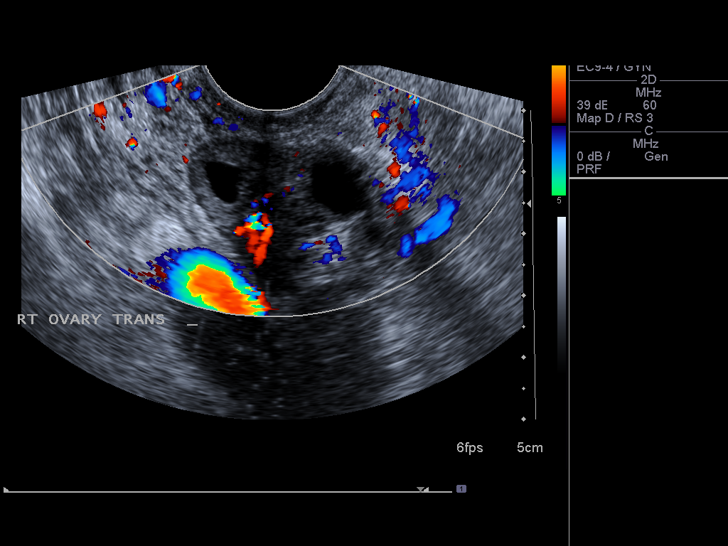
[im 36/43]
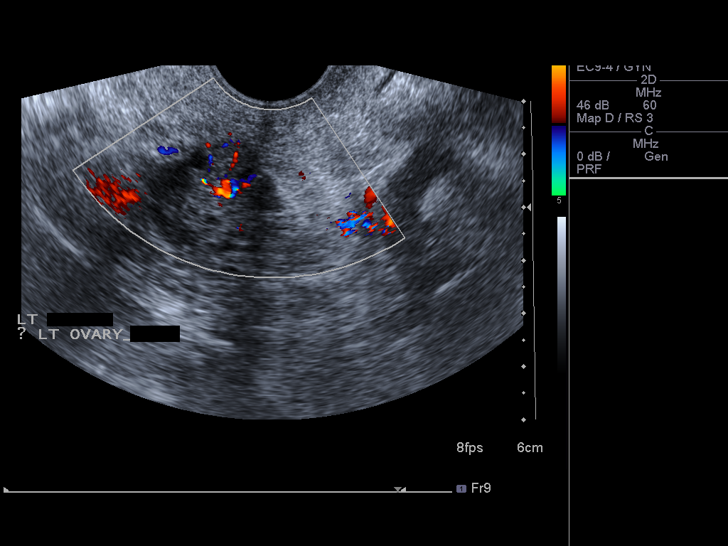
[im 39/43]
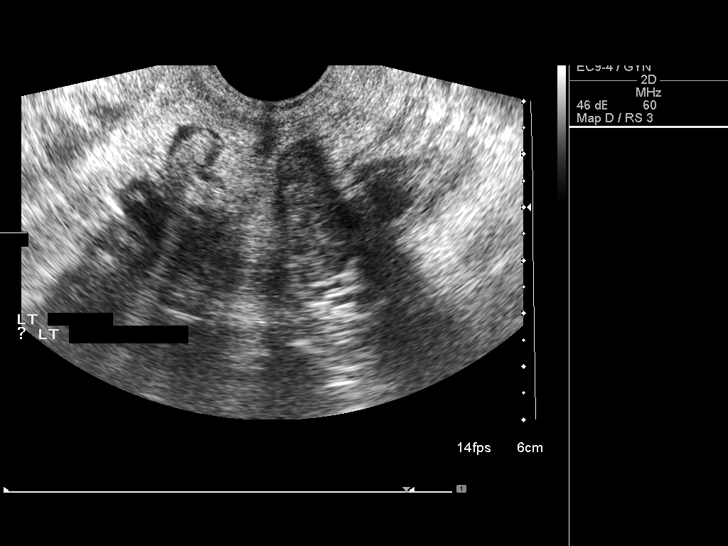
[im 43/43]
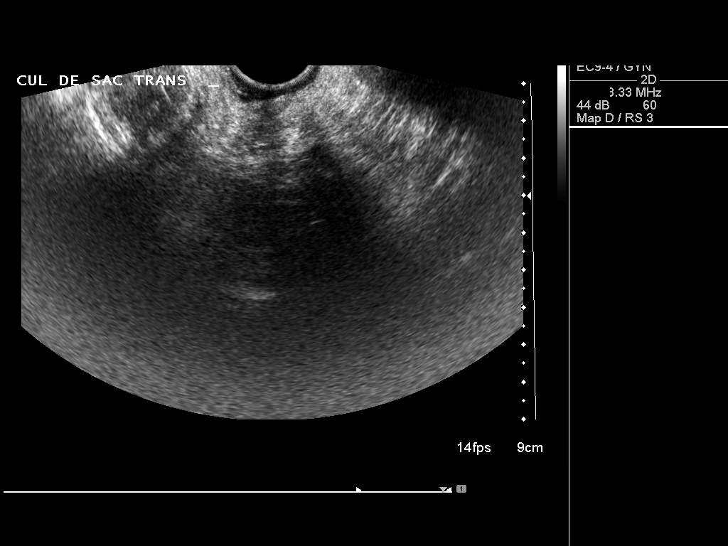

[14 of 25 positions shown; findings below may reference images not displayed]

FINDINGS: Uterus

Hysterectomy.

Right ovary

Measurements: 4.3 x 1.7 x 2.9 cm. 2.2 x 1.3 x 1.8 cm simple cyst.
Multiple small cysts, most likely follicular cyst. Some contain mild
debris.

Left ovary

Measurements: 3.1 x 2.6 x 1.2 cm. Normal appearance/no adnexal mass.

Other findings

No abnormal free fluid.
IMPRESSION: 1. Hysterectomy.

2. Small cysts right ovary, most likely small follicular cysts. Some
contain mild amount of debris.

Short-interval follow up ultrasound in 6-12 weeks is recommended,
preferably during the week following the patient's normal menses.

## 2017-09-02 IMAGING — DX DG HIP (WITH OR WITHOUT PELVIS) 2-3V*L*
3 series · 3 of 3 positions shown · non-contrast
Comparison: None.

CLINICAL DATA: Pain

EXAM:
DG HIP (WITH OR WITHOUT PELVIS) 2-3V LEFT

[hip ap]
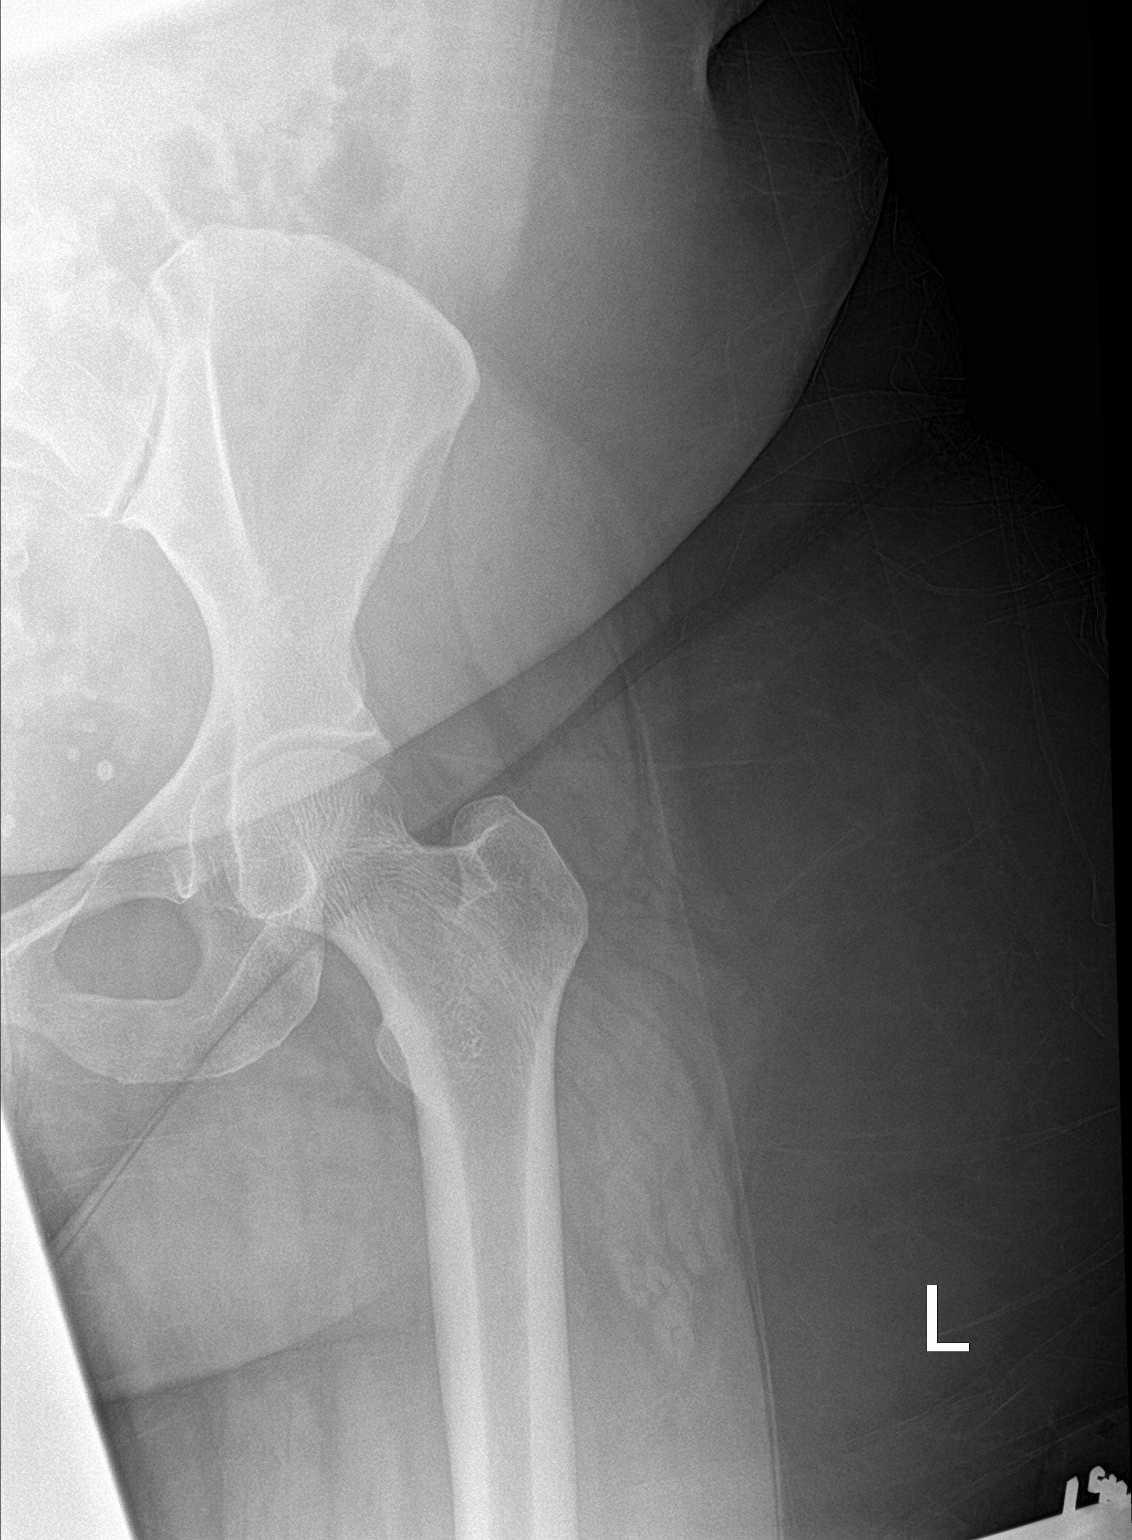

[hip lat]
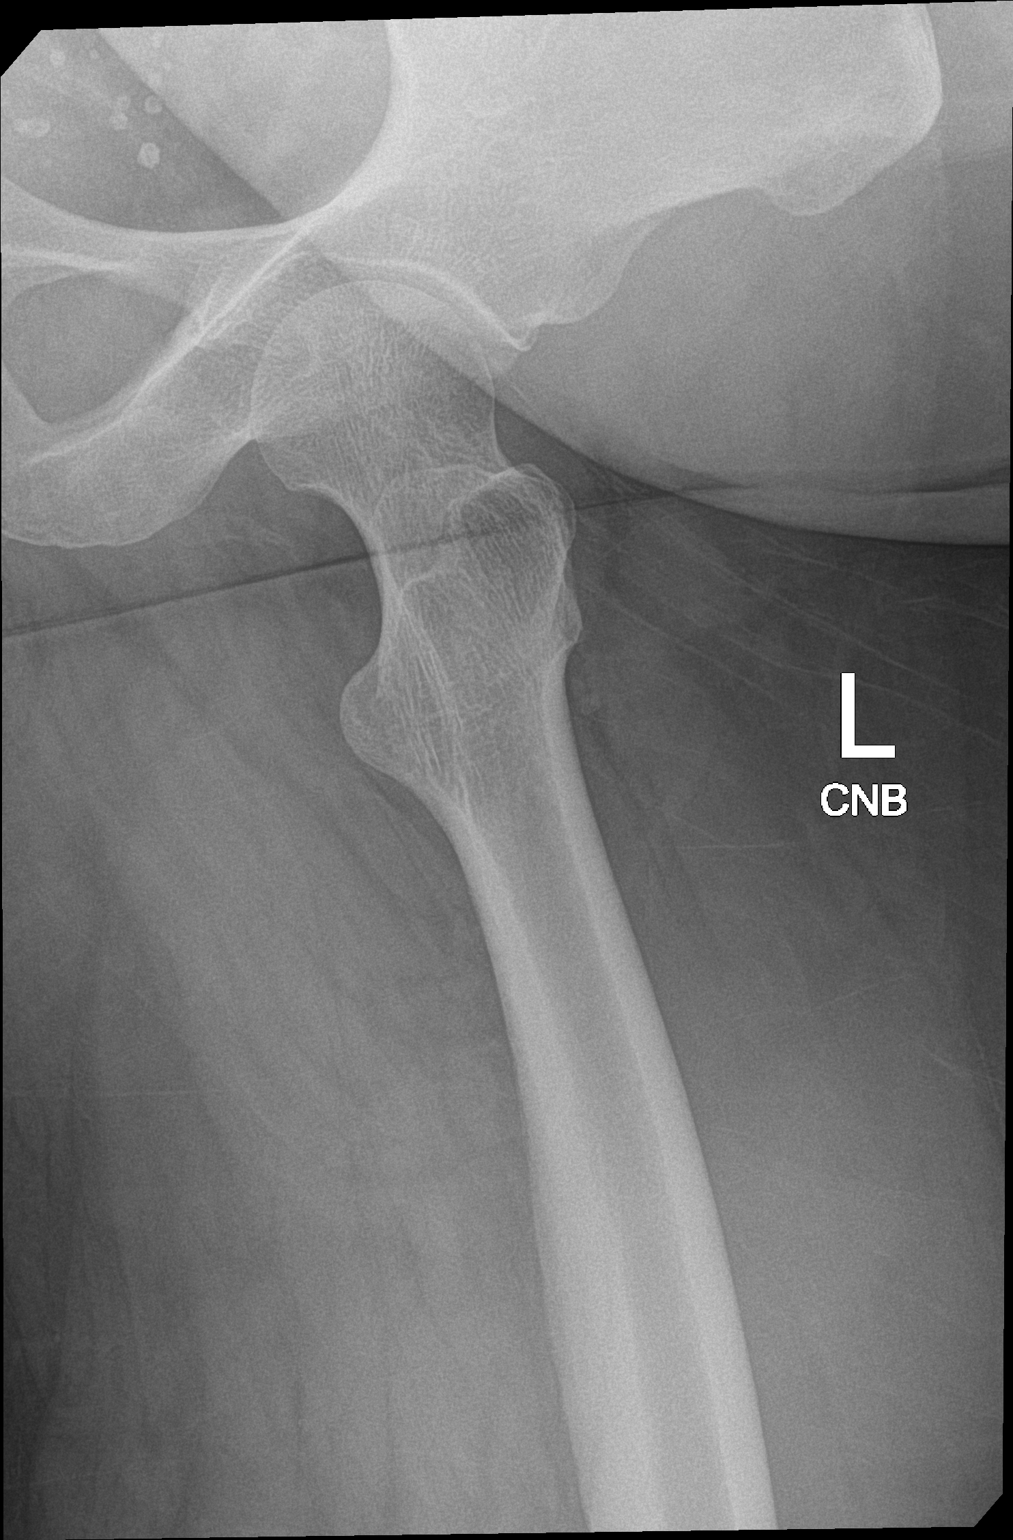

[pelvis ap]
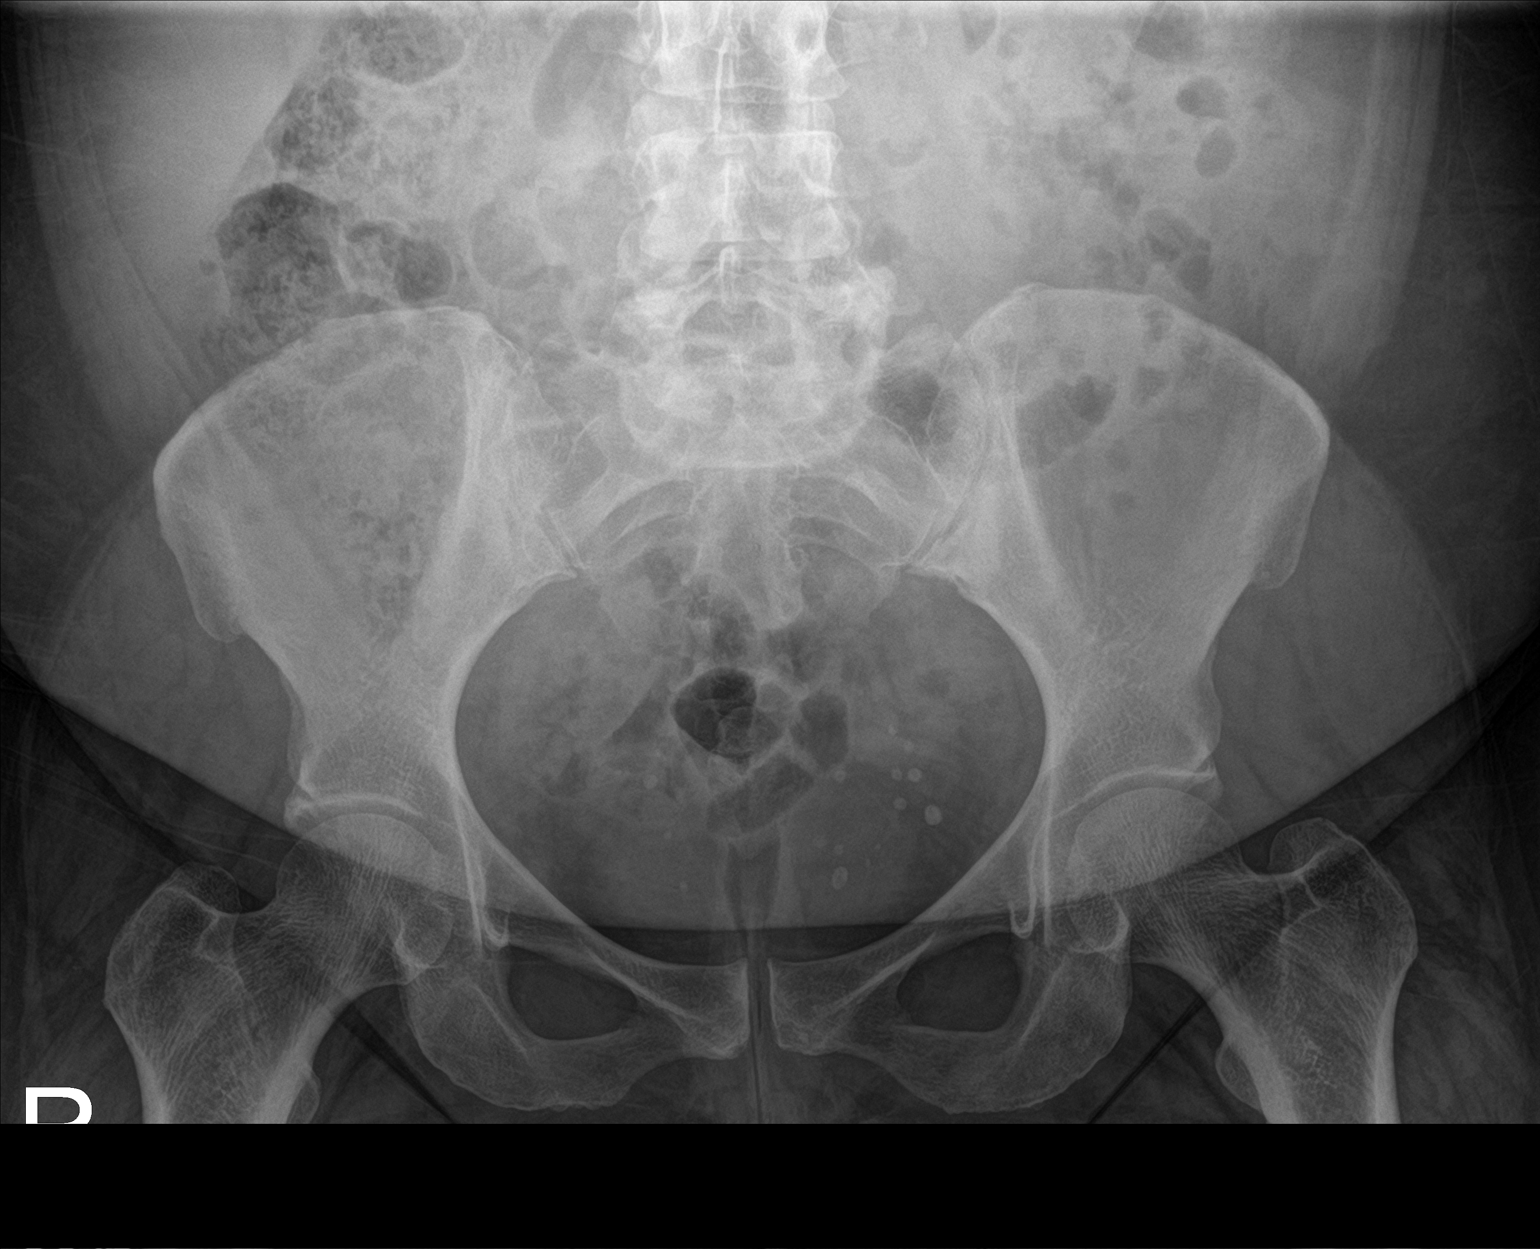

[3 of 3 positions shown; findings below may reference images not displayed]

FINDINGS: Frontal pelvis as well as frontal and lateral left hip images were
obtained. There is no demonstrable fracture or dislocation. The
joint spaces appear normal. No erosive change.
IMPRESSION: No fracture or dislocation.  No evident arthropathic change.

## 2017-11-25 ENCOUNTER — Encounter (HOSPITAL_COMMUNITY): Payer: Self-pay | Admitting: Emergency Medicine

## 2017-11-25 ENCOUNTER — Emergency Department (HOSPITAL_COMMUNITY)
Admission: EM | Admit: 2017-11-25 | Discharge: 2017-11-26 | Disposition: A | Discharge status: Home / Self Care | Payer: BC Managed Care – PPO | Attending: Emergency Medicine | Admitting: Emergency Medicine

## 2017-11-25 DIAGNOSIS — S301XXA Contusion of abdominal wall, initial encounter: Secondary | ICD-10-CM | POA: Insufficient documentation

## 2017-11-25 DIAGNOSIS — E039 Hypothyroidism, unspecified: Secondary | ICD-10-CM | POA: Insufficient documentation

## 2017-11-25 DIAGNOSIS — Y939 Activity, unspecified: Secondary | ICD-10-CM | POA: Diagnosis not present

## 2017-11-25 DIAGNOSIS — Y929 Unspecified place or not applicable: Secondary | ICD-10-CM | POA: Insufficient documentation

## 2017-11-25 DIAGNOSIS — W5511XA Bitten by horse, initial encounter: Secondary | ICD-10-CM | POA: Insufficient documentation

## 2017-11-25 DIAGNOSIS — S3991XA Unspecified injury of abdomen, initial encounter: Secondary | ICD-10-CM | POA: Diagnosis present

## 2017-11-25 DIAGNOSIS — Z79899 Other long term (current) drug therapy: Secondary | ICD-10-CM | POA: Diagnosis not present

## 2017-11-25 DIAGNOSIS — Y998 Other external cause status: Secondary | ICD-10-CM | POA: Diagnosis not present

## 2017-11-25 MED ORDER — CEPHALEXIN 500 MG PO CAPS: 500.0000 mg | ORAL_CAPSULE | Freq: Four times a day (QID) | ORAL | 0 refills | Status: AC

## 2017-11-25 MED ORDER — CEPHALEXIN 500 MG PO CAPS: 500.0000 mg | ORAL_CAPSULE | Freq: Four times a day (QID) | ORAL | 0 refills | Status: DC

## 2017-11-25 MED ORDER — HYDROCODONE-ACETAMINOPHEN 5-325 MG PO TABS: 1.0000 | ORAL_TABLET | ORAL | 0 refills | Status: AC | PRN

## 2017-11-25 MED ORDER — FENTANYL CITRATE (PF) 100 MCG/2ML IJ SOLN
100.0000 ug | Freq: Once | INTRAMUSCULAR | Status: AC
  Administered 2017-11-26: 100 ug via INTRAVENOUS
  Filled 2017-11-25: qty 2

## 2017-11-25 MED ORDER — LIDOCAINE HCL 1 % IJ SOLN
INTRAMUSCULAR | Status: AC
  Administered 2017-11-25
  Filled 2017-11-25: qty 20

## 2017-11-25 MED ORDER — CEFAZOLIN SODIUM-DEXTROSE 1-4 GM/50ML-% IV SOLN
1.0000 g | Freq: Once | INTRAVENOUS | Status: AC
  Administered 2017-11-26: 1 g via INTRAVENOUS
  Filled 2017-11-25: qty 50

## 2017-11-25 NOTE — ED Triage Notes (Signed)
Pt c/o abdominal abscess above the umbilicus. Pt was bitten by a horse one week ago and states an abscess formed, advised by UC to be seen in ED for treatment. Pt states the abscess is decreasing in size, small amounts of pus / drainage noted from the site. Site measures approximately 14 cm with redness

## 2017-11-25 NOTE — ED Provider Notes (Addendum)
[facility] Provider Note: Lowella Dell, MD, FACEP  CSN: [id number] MRN: [id number] ARRIVAL: 11/25/17 at 1303 ROOM: WA11/WA11   CHIEF COMPLAINT  Abscess (abdominal)   HISTORY OF PRESENT ILLNESS  11/25/17 11:02 PM Kiara Peters is a 47 y.o. female who was bitten on the abdomen by a horse 8 days ago.  The horse is a known horse with regular veterinary care and is up-to-date on his immunizations.  She has subsequently developed a tender, swollen area above the umbilicus.  This area is about 14 cm in diameter and is associated with some redness as well as superficial wounds.  There is some surrounding ecchymosis as well.  She rates her pain is mild at rest but this is worse with palpation or movement.  She believes there have been some small amounts of purulent drainage from the wounds.  She denies systemic symptoms such as fever or chills.  Consultation with the [facility] state controlled substances database reveals the patient has received no opioid prescriptions in the past 2 years.   Past Medical History:  Diagnosis Date  . Hypothyroidism 2000   Pt not sure if it was iatrogenic or not.  . Thyroid nodule 2000   Removed, Benign    Past Surgical History:  Procedure Laterality Date  . ABDOMINAL HYSTERECTOMY    . THYROIDECTOMY, PARTIAL  03/1999   benign nodule    Family History  Problem Relation Age of Onset  . Hypertension Mother   . Heart failure Mother   . Cancer Mother   . Diabetes Mother   . Cancer Father   . Diabetes Sister   . Hypertension Sister   . Hypertension Brother   . Diabetes Brother   . Hyperlipidemia Brother   . Hypertension Sister     Social History   Tobacco Use  . Smoking status: Never Smoker  . Smokeless tobacco: Never Used  Substance Use Topics  . Alcohol use: No  . Drug use: No    Prior to Admission medications   Medication Sig Start Date End Date Taking? Authorizing Provider  levothyroxine (SYNTHROID, LEVOTHROID) 88 MCG  tablet Take 88 mcg by mouth daily before breakfast.   Yes [provider]  vitamin B-12 (CYANOCOBALAMIN) 100 MCG tablet Take 100 mcg by mouth daily.   Yes [provider]    Allergies Patient has no known allergies.   REVIEW OF SYSTEMS  Negative except as noted here or in the History of Present Illness.   PHYSICAL EXAMINATION  Initial Vital Signs Blood pressure (!) 154/95, pulse 82, temperature 98.2 F (36.8 C), temperature source Oral, resp. rate 18, SpO2 100 %.  Examination General: Well-developed, well-nourished female in no acute distress; appearance consistent with age of record HENT: normocephalic; atraumatic Eyes: pupils equal, round and reactive to light; extraocular muscles intact Neck: supple Heart: regular rate and rhythm Lungs: clear to auscultation bilaterally Abdomen: soft; nondistended; nontender; bowel sounds present; tender, fluctuant mass above the umbilicus:   Extremities: No deformity; full range of motion; pulses normal Neurologic: Awake, alert and oriented; motor function intact in all extremities and symmetric; no facial droop Skin: Warm and dry Psychiatric: Normal mood and affect   RESULTS  Summary of this visit's results, reviewed by myself:   EKG Interpretation  Date/Time:    Ventricular Rate:    PR Interval:    QRS Duration:   QT Interval:    QTC Calculation:   R Axis:     Text Interpretation:  Laboratory Studies: No results found for this or any previous visit (from the past 24 hour(s)). Imaging Studies: No results found.  ED COURSE  Nursing notes and initial vitals signs, including pulse oximetry, reviewed.  Vitals:   11/25/17 1330 11/25/17 1342 11/25/17 1719 11/25/17 2247  BP: (!) 192/109  (!) 144/95 (!) 154/95  Pulse: 85  84 82  Resp: 18  20 18   Temp: 98.2 F (36.8 C)     TempSrc: Oral     SpO2: 100% 100% 97% 100%   11:51 PM Patient given Ancef 1 g for prophylaxis as hematomas are at risk of  secondary infection, especially as this wound has been opened and evacuated.  PROCEDURES   Evacuation of hematoma Performed by: Paula LibraMOLPUS,Jurell Basista L Consent: Verbal consent obtained. Risks and benefits: risks, benefits and alternatives were discussed Type: Hematoma  Body area: Abdomen  Anesthesia: local infiltration  Incision (2cm) was made with a scalpel.  Local anesthetic: lidocaine 1 % without epinephrine  Anesthetic total: 5 ml  Complexity: complex Blunt dissection to break up loculations  Drainage: Sanguinous with clots  Drainage amount: 200mL  Packing material: None  Wound Closure: 3 surgical staples  Patient tolerance: Patient tolerated the procedure well with no immediate complications.     ED DIAGNOSES     ICD-10-CM   1. Horse bite, initial encounter 69W55.11XA   2. Hematoma of abdominal wall, initial encounter S30.Benedetto Goad1XXA        Kiara Pranger, MD 11/25/17 2352    Paula LibraMolpus, Jiali Linney, MD 11/25/17 2356

## 2017-11-26 NOTE — ED Notes (Signed)
Pt ambulatory and independent at discharge.  Verbalized understanding of discharge instructions 

## 2018-02-24 LAB — GLUCOSE, POCT (MANUAL RESULT ENTRY): POC GLUCOSE: 91 mg/dL (ref 70–99)

## 2018-10-02 ENCOUNTER — Other Ambulatory Visit: Payer: Self-pay | Admitting: Family Medicine

## 2018-10-02 DIAGNOSIS — Z1231 Encounter for screening mammogram for malignant neoplasm of breast: Secondary | ICD-10-CM

## 2018-11-12 ENCOUNTER — Ambulatory Visit: Payer: BC Managed Care – PPO

## 2018-12-05 ENCOUNTER — Ambulatory Visit
Admission: RE | Admit: 2018-12-05 | Discharge: 2018-12-05 | Disposition: A | Discharge status: Home / Self Care | Payer: BC Managed Care – PPO | Source: Ambulatory Visit | Attending: Family Medicine | Admitting: Family Medicine

## 2018-12-05 DIAGNOSIS — Z1231 Encounter for screening mammogram for malignant neoplasm of breast: Secondary | ICD-10-CM

## 2019-09-13 ENCOUNTER — Other Ambulatory Visit: Payer: Self-pay

## 2019-09-13 DIAGNOSIS — Z20822 Contact with and (suspected) exposure to covid-19: Secondary | ICD-10-CM

## 2019-09-15 LAB — NOVEL CORONAVIRUS, NAA: SARS-CoV-2, NAA: NOT DETECTED

## 2019-11-28 ENCOUNTER — Other Ambulatory Visit: Payer: Self-pay | Admitting: Family Medicine

## 2019-11-28 DIAGNOSIS — Z1231 Encounter for screening mammogram for malignant neoplasm of breast: Secondary | ICD-10-CM

## 2020-01-07 ENCOUNTER — Ambulatory Visit
Admission: RE | Admit: 2020-01-07 | Discharge: 2020-01-07 | Disposition: A | Discharge status: Home / Self Care | Payer: BC Managed Care – PPO | Source: Ambulatory Visit | Attending: Family Medicine | Admitting: Family Medicine

## 2020-01-07 ENCOUNTER — Other Ambulatory Visit: Payer: Self-pay

## 2020-01-07 DIAGNOSIS — Z1231 Encounter for screening mammogram for malignant neoplasm of breast: Secondary | ICD-10-CM

## 2020-01-19 ENCOUNTER — Ambulatory Visit: Payer: BC Managed Care – PPO | Attending: Internal Medicine

## 2020-01-19 DIAGNOSIS — Z23 Encounter for immunization: Secondary | ICD-10-CM | POA: Insufficient documentation

## 2020-01-19 NOTE — Progress Notes (Signed)
   Covid-19 Vaccination Clinic  Name:  Kiara Peters    MRN: [id number] DOB: 31-Jul-1971  01/19/2020  Ms. Kiara Peters was observed post Covid-19 immunization for 15 minutes without incidence. She was provided with Vaccine Information Sheet and instruction to access the V-Safe system.   Ms. Kiara Peters was instructed to call 911 with any severe reactions post vaccine: Marland Kitchen Difficulty breathing  . Swelling of your face and throat  . A fast heartbeat  . A bad rash all over your body  . Dizziness and weakness    Immunizations Administered    Name Date Dose VIS Date Route   Pfizer COVID-19 Vaccine 01/19/2020  3:44 PM 0.3 mL 10/31/2019 Intramuscular   Manufacturer: [employer], [employer]   Lot: [id number]   NDC: [address]-1

## 2020-02-11 ENCOUNTER — Ambulatory Visit: Payer: BC Managed Care – PPO | Attending: Internal Medicine

## 2020-02-11 DIAGNOSIS — Z23 Encounter for immunization: Secondary | ICD-10-CM

## 2020-02-11 NOTE — Progress Notes (Signed)
   Covid-19 Vaccination Clinic  Name:  Lovelyn Sheeran    MRN: [id number] DOB: September 26, 1971  02/11/2020  Ms. Latouche was observed post Covid-19 immunization for 15 minutes without incident. She was provided with Vaccine Information Sheet and instruction to access the V-Safe system.   Ms. Shiffman was instructed to call 911 with any severe reactions post vaccine: Marland Kitchen Difficulty breathing  . Swelling of face and throat  . A fast heartbeat  . A bad rash all over body  . Dizziness and weakness   Immunizations Administered    Name Date Dose VIS Date Route   Pfizer COVID-19 Vaccine 02/11/2020  2:46 PM 0.3 mL 10/31/2019 Intramuscular   Manufacturer: [employer], [employer]   Lot: [id number]   NDC: [address]-7

## 2020-09-07 ENCOUNTER — Ambulatory Visit: Payer: BC Managed Care – PPO | Attending: Internal Medicine

## 2020-09-07 DIAGNOSIS — Z23 Encounter for immunization: Secondary | ICD-10-CM

## 2020-09-07 NOTE — Progress Notes (Signed)
   Covid-19 Vaccination Clinic  Name:  Kiara Peters    MRN: [id number] DOB: August 09, 1971  09/07/2020  Ms. Kiara Peters was observed post Covid-19 immunization for 15 minutes without incident. She was provided with Vaccine Information Sheet and instruction to access the V-Safe system.   Ms. Kiara Peters was instructed to call 911 with any severe reactions post vaccine: Marland Kitchen Difficulty breathing  . Swelling of face and throat  . A fast heartbeat  . A bad rash all over body  . Dizziness and weakness

## 2021-01-13 ENCOUNTER — Other Ambulatory Visit: Payer: Self-pay | Admitting: Family Medicine

## 2021-01-13 DIAGNOSIS — Z1231 Encounter for screening mammogram for malignant neoplasm of breast: Secondary | ICD-10-CM

## 2021-03-07 ENCOUNTER — Ambulatory Visit
Admission: RE | Admit: 2021-03-07 | Discharge: 2021-03-07 | Disposition: A | Discharge status: Home / Self Care | Payer: BC Managed Care – PPO | Source: Ambulatory Visit | Attending: Family Medicine | Admitting: Family Medicine

## 2021-03-07 ENCOUNTER — Inpatient Hospital Stay: Admission: RE | Admit: 2021-03-07 | Payer: BC Managed Care – PPO | Source: Ambulatory Visit

## 2021-03-07 ENCOUNTER — Other Ambulatory Visit: Payer: Self-pay

## 2021-03-07 DIAGNOSIS — Z1231 Encounter for screening mammogram for malignant neoplasm of breast: Secondary | ICD-10-CM

## 2022-01-31 ENCOUNTER — Other Ambulatory Visit: Payer: Self-pay | Admitting: Family Medicine

## 2022-01-31 DIAGNOSIS — Z1231 Encounter for screening mammogram for malignant neoplasm of breast: Secondary | ICD-10-CM

## 2022-03-16 ENCOUNTER — Ambulatory Visit
Admission: RE | Admit: 2022-03-16 | Discharge: 2022-03-16 | Disposition: A | Discharge status: Home / Self Care | Payer: BC Managed Care – PPO | Source: Ambulatory Visit | Attending: Family Medicine | Admitting: Family Medicine

## 2022-03-16 DIAGNOSIS — Z1231 Encounter for screening mammogram for malignant neoplasm of breast: Secondary | ICD-10-CM

## 2022-05-01 IMAGING — MG MM DIGITAL SCREENING BILAT W/ TOMO AND CAD
8 series · 8 of 24 positions shown · non-contrast
Comparison: Previous exam(s).

ACR Breast Density Category a: The breast tissue is almost entirely
fatty.

CLINICAL DATA: Screening.

EXAM:
DIGITAL SCREENING BILATERAL MAMMOGRAM WITH TOMOSYNTHESIS AND CAD
TECHNIQUE: Bilateral screening digital craniocaudal and mediolateral oblique
mammograms were obtained. Bilateral screening digital breast
tomosynthesis was performed. The images were evaluated with
computer-aided detection.

[L CC synth-2D]
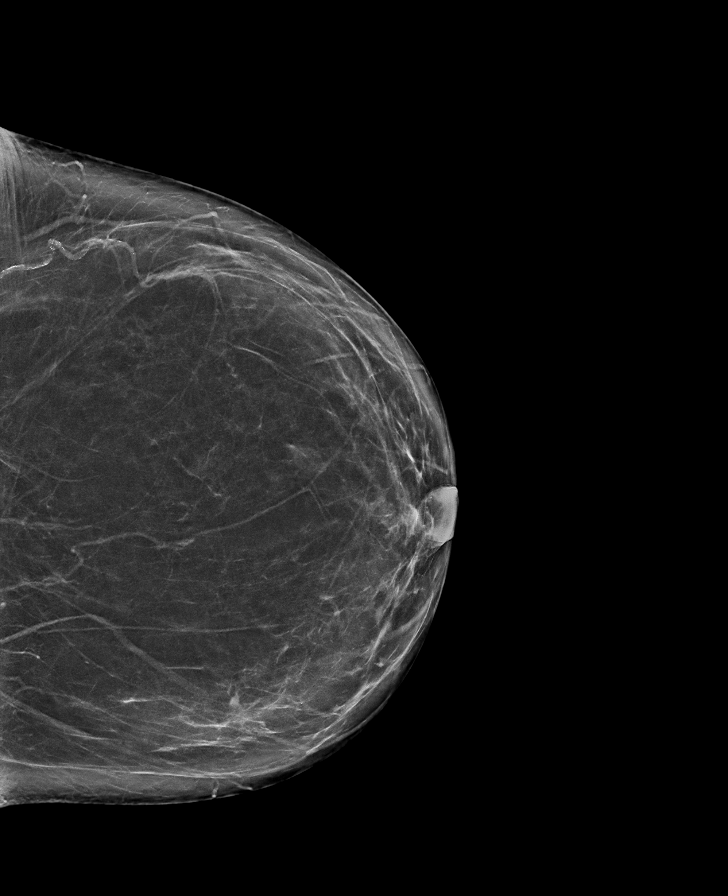

[R CC synth-2D]
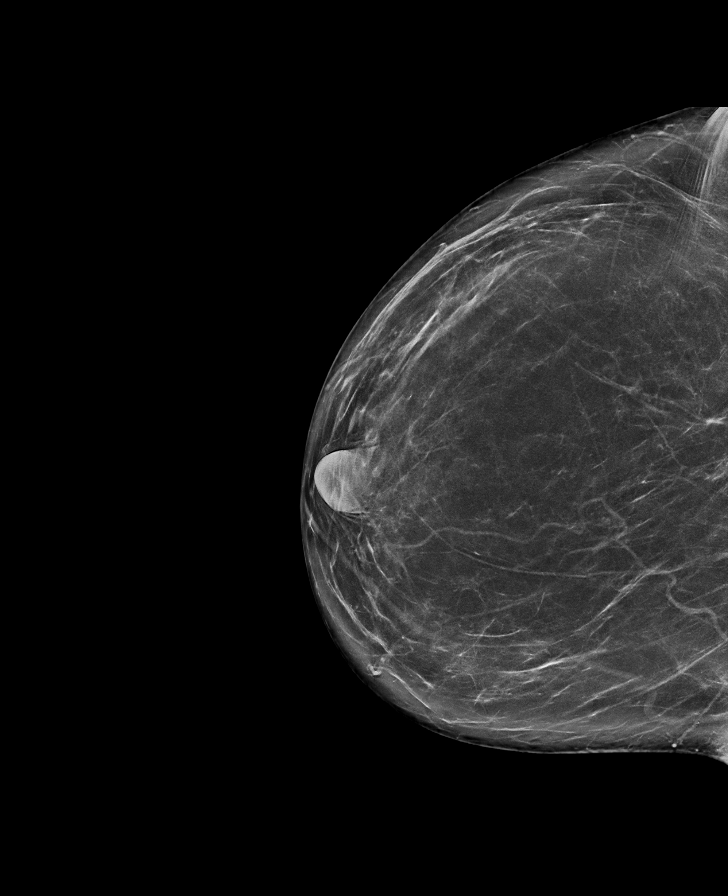

[R MLO synth-2D]
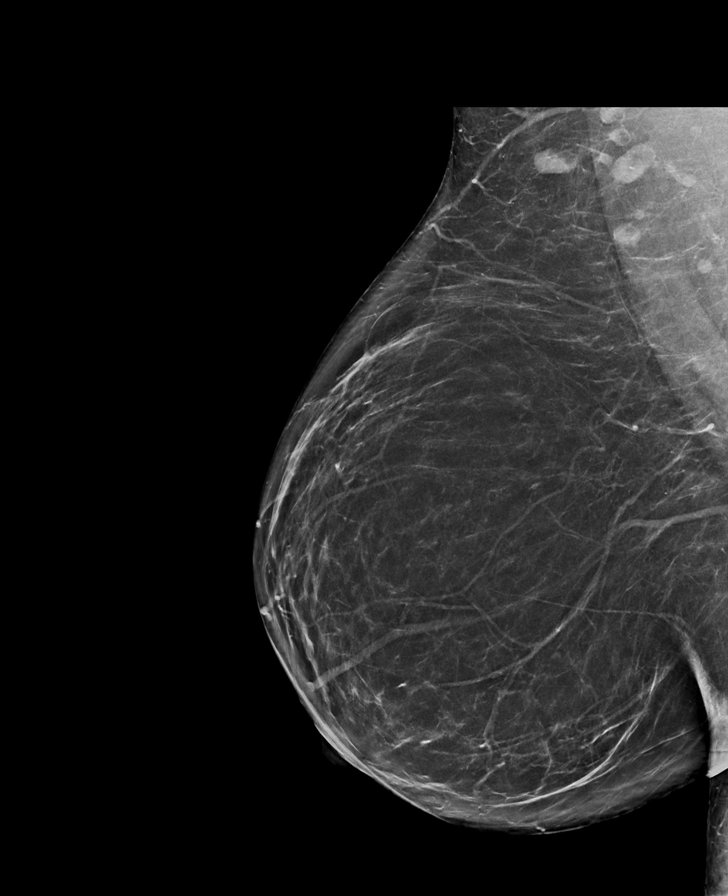

[L MLO synth-2D]
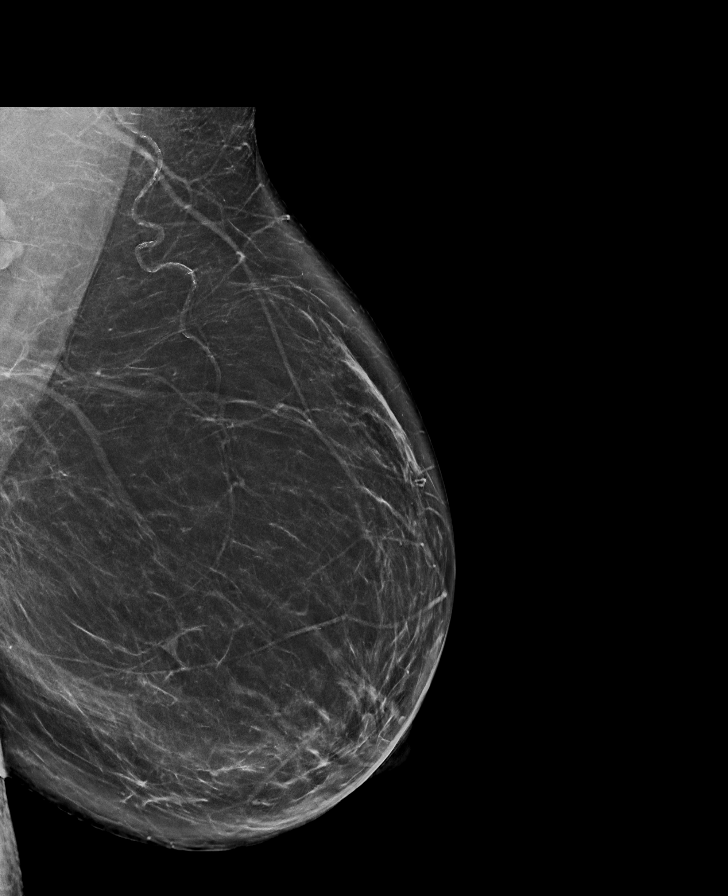

[L MLO tomo · tomo slice 43/86.0]
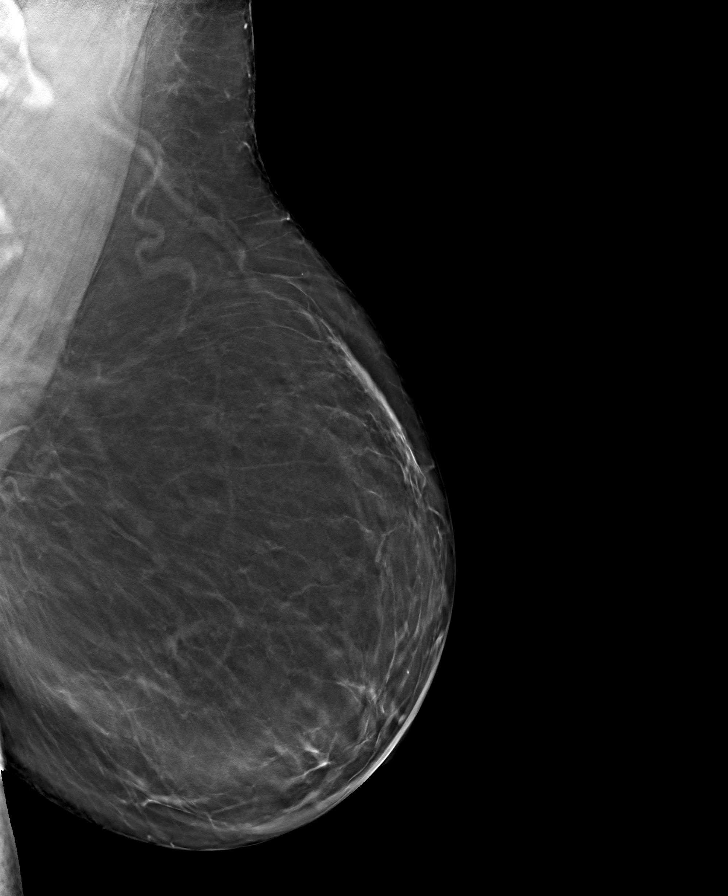

[R CC tomo · tomo slice 43/85.0]
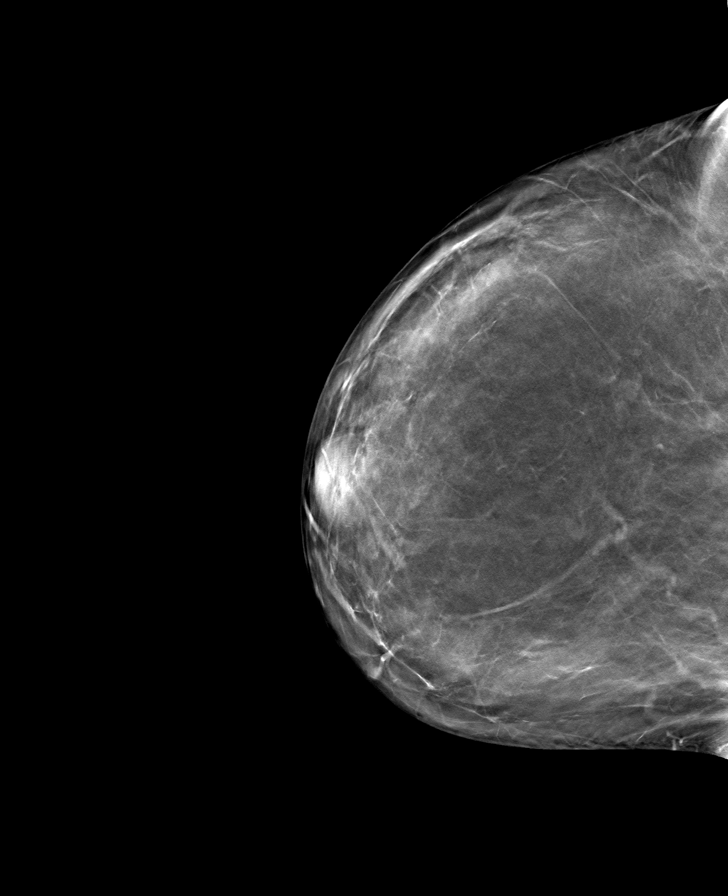

[R MLO tomo · tomo slice 43/84.0]
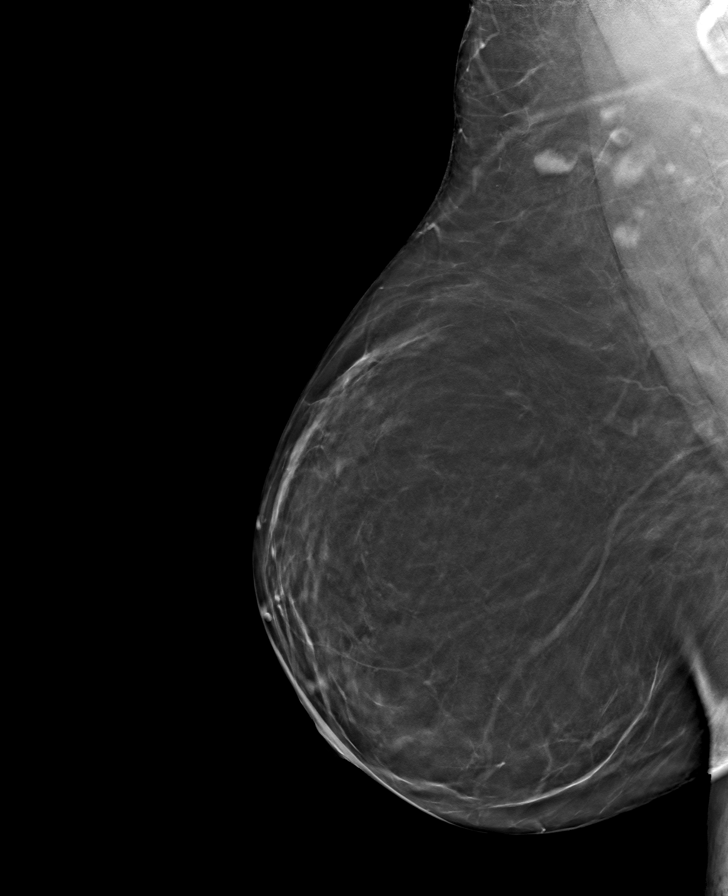

[L CC tomo · tomo slice 44/87.0]
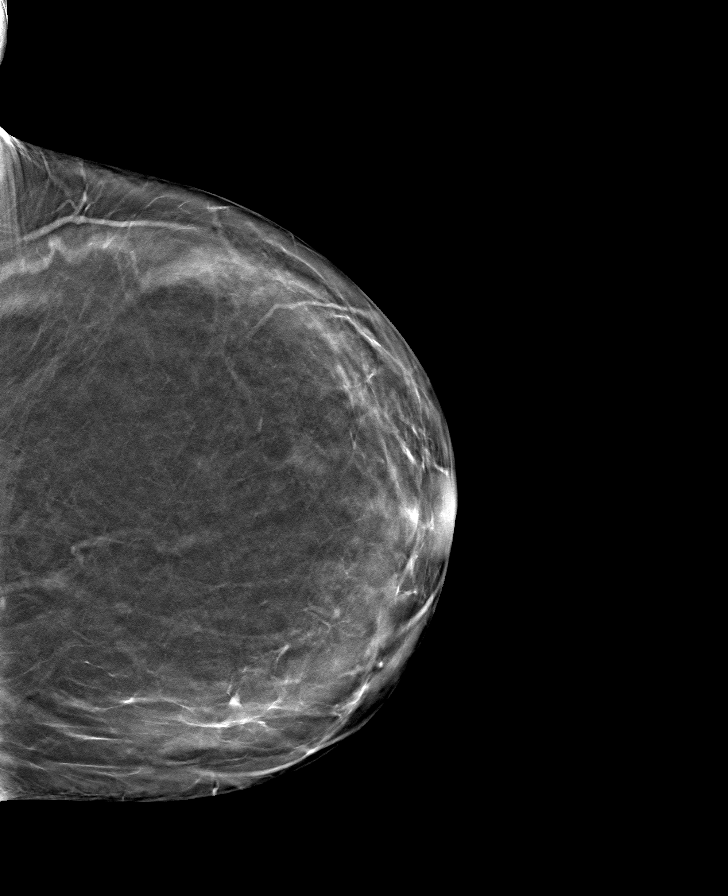

[8 of 24 positions shown; findings below may reference images not displayed]

FINDINGS: There are no findings suspicious for malignancy. The images were
evaluated with computer-aided detection.
IMPRESSION: No mammographic evidence of malignancy. A result letter of this
screening mammogram will be mailed directly to the patient.

RECOMMENDATION:
Screening mammogram in one year. (Code:JP-J-DD5)

BI-RADS CATEGORY  1: Negative.

## 2023-03-20 ENCOUNTER — Other Ambulatory Visit: Payer: Self-pay | Admitting: Family Medicine

## 2023-03-20 DIAGNOSIS — Z1231 Encounter for screening mammogram for malignant neoplasm of breast: Secondary | ICD-10-CM

## 2023-04-24 ENCOUNTER — Ambulatory Visit
Admission: RE | Admit: 2023-04-24 | Discharge: 2023-04-24 | Disposition: A | Discharge status: Home / Self Care | Payer: BC Managed Care – PPO | Source: Ambulatory Visit | Attending: Family Medicine | Admitting: Family Medicine

## 2023-04-24 DIAGNOSIS — Z1231 Encounter for screening mammogram for malignant neoplasm of breast: Secondary | ICD-10-CM

## 2024-04-29 ENCOUNTER — Other Ambulatory Visit: Payer: Self-pay | Admitting: Family Medicine

## 2024-04-29 DIAGNOSIS — Z1231 Encounter for screening mammogram for malignant neoplasm of breast: Secondary | ICD-10-CM

## 2024-05-07 ENCOUNTER — Ambulatory Visit
Admission: RE | Admit: 2024-05-07 | Discharge: 2024-05-07 | Disposition: A | Discharge status: Home / Self Care | Source: Ambulatory Visit | Attending: Family Medicine | Admitting: Family Medicine

## 2024-05-07 ENCOUNTER — Ambulatory Visit: Payer: Self-pay

## 2024-05-07 DIAGNOSIS — Z1231 Encounter for screening mammogram for malignant neoplasm of breast: Secondary | ICD-10-CM
